# Patient Record
Sex: Female | Born: 1935 | Race: White | Hispanic: No | Smoking: Former smoker
Health system: Southern US, Community
[De-identification: ages and names within clinical notes are randomized; demographics above are authoritative.]

## PROBLEM LIST (undated history)

## (undated) DIAGNOSIS — F329 Major depressive disorder, single episode, unspecified: Secondary | ICD-10-CM

## (undated) DIAGNOSIS — F419 Anxiety disorder, unspecified: Secondary | ICD-10-CM

## (undated) DIAGNOSIS — G309 Alzheimer's disease, unspecified: Secondary | ICD-10-CM

## (undated) DIAGNOSIS — I10 Essential (primary) hypertension: Secondary | ICD-10-CM

## (undated) DIAGNOSIS — I639 Cerebral infarction, unspecified: Secondary | ICD-10-CM

## (undated) DIAGNOSIS — F0280 Dementia in other diseases classified elsewhere without behavioral disturbance: Secondary | ICD-10-CM

## (undated) DIAGNOSIS — E785 Hyperlipidemia, unspecified: Secondary | ICD-10-CM

## (undated) DIAGNOSIS — K5900 Constipation, unspecified: Secondary | ICD-10-CM

## (undated) DIAGNOSIS — F32A Depression, unspecified: Secondary | ICD-10-CM

## (undated) HISTORY — DX: Anxiety disorder, unspecified: F41.9

## (undated) HISTORY — DX: Depression, unspecified: F32.A

## (undated) HISTORY — PX: TUBAL LIGATION: SHX77

## (undated) HISTORY — DX: Cerebral infarction, unspecified: I63.9

## (undated) HISTORY — DX: Major depressive disorder, single episode, unspecified: F32.9

---

## 1999-04-10 ENCOUNTER — Encounter: Payer: Self-pay | Admitting: Internal Medicine

## 2004-03-10 ENCOUNTER — Encounter: Payer: Self-pay | Admitting: Internal Medicine

## 2004-07-27 ENCOUNTER — Ambulatory Visit: Payer: Self-pay | Admitting: Internal Medicine

## 2004-08-19 ENCOUNTER — Ambulatory Visit: Payer: Self-pay | Admitting: Gastroenterology

## 2004-08-26 ENCOUNTER — Ambulatory Visit: Payer: Self-pay | Admitting: Gastroenterology

## 2004-09-02 ENCOUNTER — Ambulatory Visit: Payer: Self-pay | Admitting: Internal Medicine

## 2005-01-14 ENCOUNTER — Ambulatory Visit: Payer: Self-pay | Admitting: Internal Medicine

## 2005-01-17 ENCOUNTER — Encounter: Payer: Self-pay | Admitting: Internal Medicine

## 2005-03-16 ENCOUNTER — Ambulatory Visit: Payer: Self-pay | Admitting: Internal Medicine

## 2005-06-11 ENCOUNTER — Ambulatory Visit: Payer: Self-pay | Admitting: Internal Medicine

## 2005-07-15 ENCOUNTER — Ambulatory Visit: Payer: Self-pay | Admitting: Internal Medicine

## 2005-10-29 ENCOUNTER — Ambulatory Visit: Payer: Self-pay | Admitting: Internal Medicine

## 2005-11-25 ENCOUNTER — Ambulatory Visit: Payer: Self-pay | Admitting: Internal Medicine

## 2006-02-28 ENCOUNTER — Ambulatory Visit: Payer: Self-pay | Admitting: Internal Medicine

## 2006-04-12 ENCOUNTER — Ambulatory Visit: Payer: Self-pay | Admitting: Internal Medicine

## 2006-06-28 ENCOUNTER — Ambulatory Visit: Payer: Self-pay | Admitting: Internal Medicine

## 2006-07-12 ENCOUNTER — Ambulatory Visit: Payer: Self-pay | Admitting: Internal Medicine

## 2006-09-28 ENCOUNTER — Ambulatory Visit: Payer: Self-pay | Admitting: Internal Medicine

## 2006-11-22 ENCOUNTER — Encounter: Payer: Self-pay | Admitting: Internal Medicine

## 2006-11-22 DIAGNOSIS — I10 Essential (primary) hypertension: Secondary | ICD-10-CM | POA: Insufficient documentation

## 2006-11-22 DIAGNOSIS — L219 Seborrheic dermatitis, unspecified: Secondary | ICD-10-CM | POA: Insufficient documentation

## 2006-11-22 DIAGNOSIS — M810 Age-related osteoporosis without current pathological fracture: Secondary | ICD-10-CM | POA: Insufficient documentation

## 2006-11-22 DIAGNOSIS — M1990 Unspecified osteoarthritis, unspecified site: Secondary | ICD-10-CM | POA: Insufficient documentation

## 2006-11-22 DIAGNOSIS — K5730 Diverticulosis of large intestine without perforation or abscess without bleeding: Secondary | ICD-10-CM | POA: Insufficient documentation

## 2006-11-22 DIAGNOSIS — F329 Major depressive disorder, single episode, unspecified: Secondary | ICD-10-CM

## 2006-11-22 DIAGNOSIS — K5900 Constipation, unspecified: Secondary | ICD-10-CM

## 2007-02-01 ENCOUNTER — Telehealth: Payer: Self-pay | Admitting: Internal Medicine

## 2007-03-01 ENCOUNTER — Ambulatory Visit: Payer: Self-pay | Admitting: Internal Medicine

## 2007-03-01 DIAGNOSIS — S335XXA Sprain of ligaments of lumbar spine, initial encounter: Secondary | ICD-10-CM

## 2007-03-01 DIAGNOSIS — S339XXA Sprain of unspecified parts of lumbar spine and pelvis, initial encounter: Secondary | ICD-10-CM | POA: Insufficient documentation

## 2007-03-08 ENCOUNTER — Ambulatory Visit: Payer: Self-pay | Admitting: Internal Medicine

## 2007-03-08 ENCOUNTER — Telehealth (INDEPENDENT_AMBULATORY_CARE_PROVIDER_SITE_OTHER): Payer: Self-pay | Admitting: *Deleted

## 2007-03-08 DIAGNOSIS — S8010XA Contusion of unspecified lower leg, initial encounter: Secondary | ICD-10-CM

## 2007-05-16 ENCOUNTER — Telehealth (INDEPENDENT_AMBULATORY_CARE_PROVIDER_SITE_OTHER): Payer: Self-pay | Admitting: *Deleted

## 2007-06-05 ENCOUNTER — Encounter (INDEPENDENT_AMBULATORY_CARE_PROVIDER_SITE_OTHER): Payer: Self-pay | Admitting: *Deleted

## 2007-06-05 ENCOUNTER — Ambulatory Visit: Payer: Self-pay | Admitting: Internal Medicine

## 2007-06-09 ENCOUNTER — Ambulatory Visit: Payer: Self-pay | Admitting: Internal Medicine

## 2007-06-12 LAB — CONVERTED CEMR LAB
Basophils Absolute: 0.1 10*3/uL (ref 0.0–0.1)
Calcium: 9.5 mg/dL (ref 8.4–10.5)
Eosinophils Absolute: 0.3 10*3/uL (ref 0.0–0.6)
GFR calc Af Amer: 80 mL/min
Glucose, Bld: 101 mg/dL — ABNORMAL HIGH (ref 70–99)
HCT: 39.9 % (ref 36.0–46.0)
MCHC: 35.6 g/dL (ref 30.0–36.0)
MCV: 94.9 fL (ref 78.0–100.0)
Monocytes Absolute: 0.7 10*3/uL (ref 0.2–0.7)
Neutrophils Relative %: 54.7 % (ref 43.0–77.0)
Potassium: 4.7 meq/L (ref 3.5–5.1)
RBC: 4.2 M/uL (ref 3.87–5.11)
Sodium: 142 meq/L (ref 135–145)

## 2007-06-21 ENCOUNTER — Telehealth: Payer: Self-pay | Admitting: Internal Medicine

## 2007-09-08 ENCOUNTER — Ambulatory Visit: Payer: Self-pay | Admitting: Internal Medicine

## 2007-09-08 DIAGNOSIS — K5289 Other specified noninfective gastroenteritis and colitis: Secondary | ICD-10-CM

## 2008-02-23 ENCOUNTER — Ambulatory Visit: Payer: Self-pay | Admitting: Internal Medicine

## 2008-02-23 DIAGNOSIS — L570 Actinic keratosis: Secondary | ICD-10-CM

## 2008-02-26 LAB — CONVERTED CEMR LAB
Basophils Absolute: 0.1 10*3/uL (ref 0.0–0.1)
Chloride: 102 meq/L (ref 96–112)
Eosinophils Absolute: 0.2 10*3/uL (ref 0.0–0.7)
GFR calc Af Amer: 79 mL/min
MCHC: 34.8 g/dL (ref 30.0–36.0)
MCV: 96 fL (ref 78.0–100.0)
Neutrophils Relative %: 63.3 % (ref 43.0–77.0)
Phosphorus: 4.3 mg/dL (ref 2.3–4.6)
Platelets: 220 10*3/uL (ref 150–400)
Potassium: 4.5 meq/L (ref 3.5–5.1)
RDW: 11.9 % (ref 11.5–14.6)
TSH: 1.79 microintl units/mL (ref 0.35–5.50)

## 2008-03-11 ENCOUNTER — Telehealth: Payer: Self-pay | Admitting: Internal Medicine

## 2008-03-28 ENCOUNTER — Ambulatory Visit: Payer: Self-pay | Admitting: Internal Medicine

## 2008-03-28 ENCOUNTER — Encounter: Payer: Self-pay | Admitting: Internal Medicine

## 2008-04-29 ENCOUNTER — Telehealth: Payer: Self-pay | Admitting: Internal Medicine

## 2008-05-06 ENCOUNTER — Ambulatory Visit: Payer: Self-pay | Admitting: Internal Medicine

## 2008-05-09 ENCOUNTER — Telehealth: Payer: Self-pay | Admitting: Internal Medicine

## 2008-06-10 ENCOUNTER — Telehealth: Payer: Self-pay | Admitting: Internal Medicine

## 2008-07-24 ENCOUNTER — Ambulatory Visit: Payer: Self-pay | Admitting: Family Medicine

## 2008-07-24 DIAGNOSIS — J309 Allergic rhinitis, unspecified: Secondary | ICD-10-CM | POA: Insufficient documentation

## 2008-07-24 DIAGNOSIS — J019 Acute sinusitis, unspecified: Secondary | ICD-10-CM

## 2009-08-05 ENCOUNTER — Ambulatory Visit: Payer: Self-pay | Admitting: Internal Medicine

## 2010-01-07 ENCOUNTER — Ambulatory Visit: Payer: Self-pay | Admitting: Internal Medicine

## 2010-01-13 ENCOUNTER — Ambulatory Visit: Payer: Self-pay | Admitting: Internal Medicine

## 2010-02-04 ENCOUNTER — Ambulatory Visit: Payer: Self-pay | Admitting: Internal Medicine

## 2010-02-06 ENCOUNTER — Ambulatory Visit: Payer: Self-pay | Admitting: Internal Medicine

## 2010-03-09 ENCOUNTER — Ambulatory Visit: Payer: Self-pay | Admitting: Internal Medicine

## 2012-03-15 ENCOUNTER — Ambulatory Visit: Payer: Self-pay | Admitting: Internal Medicine

## 2012-07-11 ENCOUNTER — Encounter: Payer: Self-pay | Admitting: Gastroenterology

## 2014-08-15 ENCOUNTER — Encounter: Payer: Self-pay | Admitting: Gastroenterology

## 2015-03-27 ENCOUNTER — Encounter: Payer: Self-pay | Admitting: Gastroenterology

## 2015-07-30 DIAGNOSIS — R296 Repeated falls: Secondary | ICD-10-CM | POA: Insufficient documentation

## 2015-08-28 DIAGNOSIS — R251 Tremor, unspecified: Secondary | ICD-10-CM | POA: Insufficient documentation

## 2015-10-07 DIAGNOSIS — G250 Essential tremor: Secondary | ICD-10-CM | POA: Insufficient documentation

## 2015-12-22 DIAGNOSIS — M25551 Pain in right hip: Secondary | ICD-10-CM | POA: Insufficient documentation

## 2015-12-22 DIAGNOSIS — R351 Nocturia: Secondary | ICD-10-CM | POA: Insufficient documentation

## 2016-01-15 ENCOUNTER — Ambulatory Visit: Payer: Medicare Other | Admitting: Physical Therapy

## 2016-01-20 ENCOUNTER — Ambulatory Visit: Payer: Medicare Other | Attending: Neurology | Admitting: Physical Therapy

## 2016-01-20 ENCOUNTER — Encounter: Payer: Self-pay | Admitting: Physical Therapy

## 2016-01-20 DIAGNOSIS — R279 Unspecified lack of coordination: Secondary | ICD-10-CM | POA: Diagnosis present

## 2016-01-20 DIAGNOSIS — R2681 Unsteadiness on feet: Secondary | ICD-10-CM | POA: Insufficient documentation

## 2016-01-20 DIAGNOSIS — M6281 Muscle weakness (generalized): Secondary | ICD-10-CM | POA: Insufficient documentation

## 2016-01-20 NOTE — Patient Instructions (Signed)
ABDUCTION: Sitting (Active)    Sit with feet flat. Lift right leg slightly and draw it out to side. Complete __2_ sets of _10__ repetitions. Perform _2__ sessions per day.  Copyright  VHI. All rights reserved.  EXTENSION: Sitting (Active)    Sit with feet flat. Straighten right knee. Complete _2__ sets of _10__ repetitions. Perform _2__ sessions per day.  http://gtsc.exer.us/269   Copyright  VHI. All rights reserved.  Sit to Stand / Stand to Sit / Transfers    Sit on edge of a solid chair with arms, feet flat on floor. Lean forward over feet and stand up with hands on chair arms. Sit down slowly with hands on chair arms. Repeat __5__ times per session. Do _3-4___ sessions per day.  Copyright  VHI. All rights reserved.  FLEXION: Sitting - Exercise Ball (Active)    Sit, both feet flat. Lift right knee toward ceiling. Complete __2_ sets of _10 repetitions. Perform __2_ sessions per day.  http://gtsc.exer.us/25   Copyright  VHI. All rights reserved.

## 2016-01-20 NOTE — Therapy (Signed)
[place] MAIN [facility] SERVICES [address] [place], [place], [address] Phone: [phone]   Fax:  [phone]  Physical Therapy Evaluation  Patient Details  Name: Latoya Morris MRN: [id number] Date of Birth: July 14, 1934 Referring Provider: Dr. Melrose Nakayama  Encounter Date: 01/20/2016      PT End of Session - 01/20/16 1608    Visit Number 1   Number of Visits 13   Date for PT Re-Evaluation 03/02/16   Authorization Type G Code 1   Authorization Time Period 10   PT Start Time 1330   PT Stop Time [id number]   PT Time Calculation (min) 61 min   Equipment Utilized During Treatment Gait belt   Activity Tolerance Patient tolerated treatment well;No increased pain   Behavior During Therapy [facility] for tasks assessed/performed      Past Medical History  Diagnosis Date  . Anxiety   . Depression   . Stroke [facility])     Doesn't remember year of stroke other than it has been a long time.    Past Surgical History  Procedure Laterality Date  . Tubal ligation      There were no vitals filed for this visit.       Subjective Assessment - 01/20/16 1333    Subjective Patient is a pleasant 82 y.o. female s/p chronic stroke with reported increased difficulties in ambulation, standing from sitting, and balance. 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 She has reported that she does experience falls with her most recent one occurring 2-3 weeks ago. Patient notes difficulty using stairs and needs handrails in order to perform. Patient uses no assist device during ambulation when inside the house and reports not leaving house frequently. When she leaves the house she reports using an electric carts when she goes to [employer]. Patient reports N/T in RLE, which she says she cannot remember the onest.    Pertinent History Pertinent Factors that Affect Rehab Potential:  limited transportation, previous stroke, falls, speech difficifulties    Limitations Walking;Standing;Sitting   How long can you sit comfortably? N/A   How long can you stand comfortably? Few minutes, which is limited because of weakness   How long can you walk comfortably? 200 feet   Patient Stated Goals walk better,    Currently in Pain? No/denies            [facility] PT Assessment - 01/21/16 0001    Assessment   Medical Diagnosis Difficulty walking   Referring Provider Dr. Melrose Nakayama   Onset Date/Surgical Date 12/08/15   Hand Dominance Right   Next MD Visit 3-4 months   Prior Therapy Received previously with stroke approximately 5 years ago; reports good progress with therapy; denies any rehab recently;   Precautions   Precautions Fall   Restrictions   Weight Bearing Restrictions No   Balance Screen   Has the patient fallen in the past 6 months Yes   How many times? 1   Has the patient had a decrease in activity level because of a fear of falling?  Yes   Is the patient reluctant to leave their home because of a fear of falling?  Yes   Home Environment   Additional Comments Lives with daughter in a single level home, 7-8 stairs to enter, railings both sides    Prior Function   Level of Independence Independent with household mobility  without device   Vocation Retired   [employer], looking at the [place]   Overall Cognitive Status Within Functional Limits for tasks assessed   Observation/Other Assessments   Observations Cranial nerves assessed through testing and patient report: All CNs are normal except patient has diminished CNXII on the L as indicated by an inability to push tongue into cheek on that side of mouth. Brachial, Achilles, and Patella reflexes 2+   Activities of Balance Confidence Scale (ABC Scale)  36%, low level of physical functioning    Sensation   Light Touch Appears Intact   Additional Comments Patient has minimal swelling in R  ankle as compared to L ankle.    Coordination   Gross Motor Movements are Fluid and Coordinated No   Coordination and Movement Description Patient has mild tremor predominantly in R UE at rest; tremor increased with movement. Performed LE RAMPs appropriately.   Finger Nose Finger Test L UE intact; Difficulties performing on R UE with increased tremors/dymetria, although no past pointing occurred.   Posture/Postural Control   Posture Comments Patient's seated posture reveals good balance except with LE movements with seated balance decreasing to a fair. Patient has slumped posture with forward head and rounded shoulders. Standing posture reveals slightly greater WB on L LE with continued slumped posture.     AROM   Overall AROM Comments Gross AROM assessment was [facility] throughout UE/LE with slight trunk extension compensation to perform B hip flexion.   Strength   Right Shoulder ABduction 3+/5   Left Shoulder ABduction 4/5   Right Hip Flexion 3-/5   Left Hip Flexion 4-/5   Right Knee Flexion 3+/5   Right Knee Extension 4/5   Left Knee Flexion 4-/5   Left Knee Extension 4+/5   Right Ankle Dorsiflexion 5/5   Left Ankle Dorsiflexion 5/5   Transfers   Comments From lower surface, patient needs min assist, with harder chair patient could perform at supervision.   Ambulation/Gait   Gait Comments Patient requires CGA/supervision with ambulation. Patient performs gait with decrease stride/step length, wider base of support, with decreased hip/knee flexion and decreased pelvic rotation. Patient caught R foot with no loss of balance.   Standardized Balance Assessment   Five times sit to stand comments  28.8 seconds; no UE use; >14.2 seconds indications patient is likely to have a balance dysfunction   10 Meter Walk 0.65, without AD; indications patient needs intervention to reduce falls risk   High Level Balance   High Level Balance Comments Difficulties maintaining sitting balance with LE movements,  which indicates that patient most likely has a weak core. Standing eyes open and closed patient had fair balance with minimal swaying but no loss of balance.          Treatment:  HEP Initiated (see patient instructions) Sit<>stand from regular chair without HHA x3-5 reps with cues for safe chair positioning; Seated: LAQ x5 reps BLE Alternate march x5 bilaterally; Hip abduction x5 reps BLE;  Patient required min-moderate verbal/tactile cues for correct exercise technique.                 PT Education - 01/20/16 1607    Education provided Yes   Education Details HEP initiated, goals of therapy,    Person(s) Educated Patient   Methods Explanation;Demonstration;Verbal cues   Comprehension Verbalized understanding;Returned demonstration             PT Long Term Goals - 01/20/16 1656  PT LONG TERM GOAL #1   Title Patient will demonstrate the ability to perform HEP independently in order to maintain strength and endurance gains in physical therapy to improve quality of life.   Time 6   Period Weeks   Status New   PT LONG TERM GOAL #2   Title Patient will decrease 5 times sit to stand <14 seconds in order to decrease her risk for falls and move safely in her home.   Time 6   Period Weeks   Status New   PT LONG TERM GOAL #3   Title Patient will increase her 10 meter walk time to 1.0 in order to decrease her risk for falls and perform ADLs safely.    Time 6   Period Weeks   Status New   PT LONG TERM GOAL #4   Title Patient will increase her overall strength to 4+/5 in UE and LE to allow the patient to perform gait and transfers more easily with decreased fall risk.   Time 6   Period Weeks   Status New   PT LONG TERM GOAL #5   Title Patient will improve Berg Balance score to >42 to increase patients mobility and decrease her risk for falls in the home with ADLs.    Time 6   Period Weeks   Status New               Plan - 01/20/16 1614     Clinical Impression Statement Patient is a 82 y.o. female with past stroke and general deconditioning resulting in deficits of gait, balance, strength, and coordination. Patient is at increased risk for falls based on unsteady gait appearance, 5 times sit to stand, and 10 meter walk. Patient's gait is slow with decreased UE swing, pelvic rotation, and stride/step length. Patient has general weakness with R>L resulting in difficulty performing sit to stands consistently. Patient shows fatigue after outcomes measures and ambulation for short distances, which she states requires her to take more rest breaks during ADLs. Patient would benefit from continued skilled PT in order to address balance deficits, decreased strength, gait abnormalities, and endurance in order to increase her safety in her home and improve her performance in ADLs.    Rehab Potential Fair   Clinical Impairments Affecting Rehab Potential Positive factors: motivated,  Negative factors: multiple falls, previous stroke, speech difficulties Clinical presentation: evolving due to being a high fall risk and has low physical functioning;    PT Frequency 2x / week   PT Duration 6 weeks   PT Treatment/Interventions ADLs/Self Care Home Management;Aquatic Therapy;Electrical Stimulation;Moist Heat;DME Instruction;Gait training;Stair training;Functional mobility training;Therapeutic activities;Therapeutic exercise;Balance training;Neuromuscular re-education;Patient/family education;Manual techniques;Energy conservation;Cryotherapy;Orthotic Fit/Training   PT Next Visit Plan Berg Balance, address balance, strengthening   PT Home Exercise Plan HEP initiated    Consulted and Agree with Plan of Care Patient;Family member/caregiver   Family Member Consulted Daughter      Patient will benefit from skilled therapeutic intervention in order to improve the following deficits and impairments:  Decreased activity tolerance, Decreased balance, Decreased  coordination, Decreased endurance, Decreased mobility, Decreased safety awareness, Decreased strength, Impaired UE functional use, Pain, Postural dysfunction, Improper body mechanics, Abnormal gait  Visit Diagnosis: Unsteadiness on feet - Plan: PT plan of care cert/re-cert  Muscle weakness (generalized) - Plan: PT plan of care cert/re-cert  Unspecified lack of coordination - Plan: PT plan of care cert/re-cert      G-Codes - [id number]    Functional Assessment Tool Used  10 meter walk, 5 times sit<>Stand, clinical judgement   Functional Limitation Mobility: Walking and moving around   Mobility: Walking and Moving Around Current Status [phone]) At least 40 percent but less than 60 percent impaired, limited or restricted   Mobility: Walking and Moving Around Goal Status [phone]) At least 20 percent but less than 40 percent impaired, limited or restricted       Problem List Patient Active Problem List   Diagnosis Date Noted  . SINUSITIS- ACUTE-NOS 07/24/2008  . ALLERGIC RHINITIS 07/24/2008  . ACTINIC KERATOSIS 02/23/2008  . GASTROENTERITIS 09/08/2007  . CONTUSION, LOWER LEG 03/08/2007  . LUMBOSACRAL STRAIN 03/01/2007  . DEPRESSION 2020/08/1706  . HYPERTENSION 2020/08/1706  . DIVERTICULOSIS, COLON 2020/08/1706  . CONSTIPATION 2020/08/1706  . SEBORRHEA 2020/08/1706  . OSTEOARTHRITIS 2020/08/1706  . OSTEOPOROSIS 2020/08/1706   Tilman Neat, SPT This entire session was performed under direct supervision and direction of a licensed therapist/therapist assistant . I have personally read, edited and approve of the note as written.  Trotter,Margaret PT, DPT 01/21/2016, 8:30 AM  [place] MAIN [facility] SERVICES [address] [place], [place], [address] Phone: [phone]   Fax:  [phone]  Name: KANANI ERNEY MRN: [id number] Date of Birth: [birth year]

## 2016-01-22 ENCOUNTER — Ambulatory Visit: Payer: Medicare Other | Admitting: Physical Therapy

## 2016-01-26 ENCOUNTER — Encounter: Payer: Self-pay | Admitting: Physical Therapy

## 2016-01-26 ENCOUNTER — Ambulatory Visit: Payer: Medicare Other | Admitting: Physical Therapy

## 2016-01-26 DIAGNOSIS — R279 Unspecified lack of coordination: Secondary | ICD-10-CM

## 2016-01-26 DIAGNOSIS — R2681 Unsteadiness on feet: Secondary | ICD-10-CM | POA: Diagnosis not present

## 2016-01-26 DIAGNOSIS — M6281 Muscle weakness (generalized): Secondary | ICD-10-CM

## 2016-01-26 NOTE — Therapy (Signed)
[place] MAIN [facility] SERVICES [address] [place], [place], [address] Phone: [phone]   Fax:  [phone]  Physical Therapy Treatment  Patient Details  Name: Latoya Morris MRN: [id number] Date of Birth: 06/05/1934 Referring Provider: Dr. Melrose Nakayama  Encounter Date: 01/26/2016      PT End of Session - 01/26/16 1742    Visit Number 2   Number of Visits 13   Date for PT Re-Evaluation 03/02/16   Authorization Type G Code 2   Authorization Time Period 10   PT Start Time [id number]   PT Stop Time 1735   PT Time Calculation (min) 47 min   Equipment Utilized During Treatment Gait belt   Activity Tolerance Patient tolerated treatment well;No increased pain   Behavior During Therapy [facility] for tasks assessed/performed      Past Medical History  Diagnosis Date  . Anxiety   . Depression   . Stroke [facility])     Doesn't remember year of stroke other than it not being recent    Past Surgical History  Procedure Laterality Date  . Tubal ligation      There were no vitals filed for this visit.      Subjective Assessment - 01/26/16 1651    Subjective Patient reports having no falls since last visit, reports not doing HEP due to losing her sheet;and reports feeling more fatigued due to the higher heat and humidity   Pertinent History Pertinent Factors that Affect Rehab Potential: limited transportation, previous stroke, falls, speech difficulties    Limitations Walking;Standing;Sitting   How long can you sit comfortably? N/A   How long can you stand comfortably? Few minutes, which is limited because of weakness   How long can you walk comfortably? 200 feet   Patient Stated Goals walk better,    Currently in Pain? No/denies            [facility] PT Assessment - 01/26/16 0001    Berg Balance Test   Sit to Stand Able to stand without using hands and stabilize independently   Standing Unsupported Able to stand safely 2 minutes   Sitting with Back  Unsupported but Feet Supported on Floor or Stool Able to sit safely and securely 2 minutes   Stand to Sit Sits safely with minimal use of hands   Transfers Able to transfer safely, minor use of hands   Standing Unsupported with Eyes Closed Able to stand 10 seconds safely   Standing Ubsupported with Feet Together Able to place feet together independently and stand for 1 minute with supervision   From Standing, Reach Forward with Outstretched Arm Reaches forward but needs supervision   From Standing Position, Pick up Object from [place] to pick up shoe, needs supervision   From Standing Position, Turn to Look Behind Over each Shoulder Looks behind from both sides and weight shifts well   Turn 360 Degrees Able to turn 360 degrees safely but slowly   Standing Unsupported, Alternately Place Feet on Step/Stool Able to stand independently and complete 8 steps >20 seconds   Standing Unsupported, One Foot in [employer] balance while stepping or standing   Standing on One Leg Unable to try or needs assist to prevent fall   Total Score 40       Treatment:  Berg Balance Assessment performed with multiple rest breaks; increased difficulty with SLS, tandem stance, 360 degree turn, and foot taps  Heel/toe raises; 2 x 10 reps; instructed to perform with  very little 1 UE support, min assist required to stabilize patient especially with toe raises.  Seated hip ABD/ADD with yellow theraband; 3 x 10 reps, patient instructed to move both legs equal distance and keep feet still to address gluteus medius weakness.  Patient instructed to perform step ups on 4" step with no UE support stepping up and 1 UE support stepping down due to decreased safety with eccentric lowering; 1 x 7-10 each LE.   Patient required multiple rest breaks due to fatigue and low endurance; SPT re-instructed and progressed HEP during breaks including sit to stands, seated hip ABD/ADD with no resistance, seated marches, and  LAQs.   Throughout testing and exercises, patient required min assist to maintain balance and min VCs to perform activities with appropriate technique in order to address specific muscles.                   PT Education - 01/26/16 1741    Education provided Yes   Education Details muscles contributing to functional weakness, muscles soreness    Person(s) Educated Patient   Methods Explanation;Demonstration;Verbal cues   Comprehension Verbal cues required;Verbalized understanding;Returned demonstration             PT Long Term Goals - 01/20/16 1656    PT LONG TERM GOAL #1   Title Patient will demonstrate the ability to perform HEP independently in order to maintain strength and endurance gains in physical therapy to improve quality of life.   Time 6   Period Weeks   Status New   PT LONG TERM GOAL #2   Title Patient will decrease 5 times sit to stand <14 seconds in order to decrease her risk for falls and move safely in her home.   Time 6   Period Weeks   Status New   PT LONG TERM GOAL #3   Title Patient will increase her 10 meter walk time to 1.0 in order to decrease her risk for falls and perform ADLs safely.    Time 6   Period Weeks   Status New   PT LONG TERM GOAL #4   Title Patient will increase her overall strength to 4+/5 in UE and LE to allow the patient to perform gait and transfers more easily with decreased fall risk.   Time 6   Period Weeks   Status New   PT LONG TERM GOAL #5   Title Patient will improve Berg Balance score to >42 to increase patients mobility and decrease her risk for falls in the home with ADLs.    Time 6   Period Weeks   Status New               Plan - 01/26/16 1744    Clinical Impression Statement During Berg balance testing, patient performed static double leg stance well but has increased difficulty with single leg stance and Rombergs. Patient has weakness in BLE which seems to effect her balance in single leg  stance.  Patient notes fatigue after exercises and requires multiple rest breaks. Patient would continue to benefit from skilled PT intervention in order to address weakness, balance, and improve safety in her ADL performance.    Rehab Potential Fair   Clinical Impairments Affecting Rehab Potential Positive factors: motivated,  Negative factors: multiple falls, previous stroke, speech difficulties Clinical presentation: evolving due to being a high fall risk and has low physical functioning;    PT Frequency 2x / week   PT Duration 6 weeks  PT Treatment/Interventions ADLs/Self Care Home Management;Aquatic Therapy;Electrical Stimulation;Moist Heat;DME Instruction;Gait training;Stair training;Functional mobility training;Therapeutic activities;Therapeutic exercise;Balance training;Neuromuscular re-education;Patient/family education;Manual techniques;Energy conservation;Cryotherapy;Orthotic Fit/Training   PT Next Visit Plan Address balance, strengthening   PT Home Exercise Plan HEP progressed    Consulted and Agree with Plan of Care Patient;Family member/caregiver   Family Member Consulted Daughter      Patient will benefit from skilled therapeutic intervention in order to improve the following deficits and impairments:  Decreased activity tolerance, Decreased balance, Decreased coordination, Decreased endurance, Decreased mobility, Decreased safety awareness, Decreased strength, Impaired UE functional use, Pain, Postural dysfunction, Improper body mechanics, Abnormal gait  Visit Diagnosis: Unsteadiness on feet  Muscle weakness (generalized)  Unspecified lack of coordination     Problem List Patient Active Problem List   Diagnosis Date Noted  . SINUSITIS- ACUTE-NOS 07/24/2008  . ALLERGIC RHINITIS 07/24/2008  . ACTINIC KERATOSIS 02/23/2008  . GASTROENTERITIS 09/08/2007  . CONTUSION, LOWER LEG 03/08/2007  . LUMBOSACRAL STRAIN 03/01/2007  . DEPRESSION December 14, 202008  . HYPERTENSION  December 14, 202008  . DIVERTICULOSIS, COLON December 14, 202008  . CONSTIPATION December 14, 202008  . SEBORRHEA December 14, 202008  . OSTEOARTHRITIS December 14, 202008  . OSTEOPOROSIS December 14, 202008   Tilman Neat, SPT [place] 01/26/2016, 5:52 PM  [place] MAIN [facility] SERVICES [address] [place], [place], [address] Phone: [phone]   Fax:  [phone]  Name: Latoya Morris MRN: [id number] Date of Birth: [birth year]

## 2016-01-26 NOTE — Patient Instructions (Signed)
Sit to Stand / Stand to Sit / Transfers    Sit on edge of a solid chair with arms, feet flat on floor. Lean forward over feet and stand up with hands on chair arms. Sit down slowly with hands on chair arms. Repeat __10__ times per session. Do __2__ sessions per day.  Copyright  VHI. All rights reserved.  EXTENSION: Sitting - Exercise Ball (Active)    Sit with feet flat. Straighten right knee. Complete _2__ sets of _10__ repetitions. Perform __2_ sessions per day.  http://gtsc.exer.us/275   Copyright  VHI. All rights reserved.  FLEXION: Sitting (Active)    Sit, both feet flat. Lift right knee toward ceiling. . Complete __2_ sets of _10__ repetitions. Perform _2__ sessions per day.  Copyright  VHI. All rights reserved.  ABDUCTION: Sitting - Exercise Ball: Resistance Band (Active)    Sit with feet flat. Lift right leg slightly and, against yellow resistance band, draw it out to side. Complete _3__ sets of _10__ repetitions. Perform _2__ sessions per day.  Copyright  VHI. All rights reserved.

## 2016-01-27 ENCOUNTER — Encounter: Payer: Self-pay | Admitting: Physical Therapy

## 2016-01-29 ENCOUNTER — Ambulatory Visit: Payer: Medicare Other | Admitting: Physical Therapy

## 2016-01-29 ENCOUNTER — Encounter: Payer: Self-pay | Admitting: Physical Therapy

## 2016-01-29 DIAGNOSIS — R279 Unspecified lack of coordination: Secondary | ICD-10-CM

## 2016-01-29 DIAGNOSIS — R2681 Unsteadiness on feet: Secondary | ICD-10-CM

## 2016-01-29 DIAGNOSIS — M6281 Muscle weakness (generalized): Secondary | ICD-10-CM

## 2016-01-29 NOTE — Therapy (Signed)
[place] MAIN [facility] SERVICES [address] [place], [place], [address] Phone: [phone]   Fax:  [phone]  Physical Therapy Treatment  Patient Details  Name: Latoya Morris MRN: [id number] Date of Birth: 1933-11-29 Referring Provider: Dr. Melrose Nakayama  Encounter Date: 01/29/2016      PT End of Session - 01/29/16 1650    Visit Number 3   Number of Visits 13   Date for PT Re-Evaluation 03/02/16   Authorization Type G Code 3   Authorization Time Period 10   PT Start Time 1600   PT Stop Time 1645   PT Time Calculation (min) 45 min   Equipment Utilized During Treatment Gait belt   Activity Tolerance Patient tolerated treatment well;Patient limited by fatigue   Behavior During Therapy [facility] for tasks assessed/performed      Past Medical History  Diagnosis Date  . Anxiety   . Depression   . Stroke [facility])     Doesn't remember year of stroke other than it not being recent    Past Surgical History  Procedure Laterality Date  . Tubal ligation      There were no vitals filed for this visit.      Subjective Assessment - 01/29/16 1649    Subjective Pt reports being compliant with her HEP.  She does report some numbess in her RLE that is more than her usual.     Pertinent History Pertinent Factors that Affect Rehab Potential: limited transportation, previous stroke, falls, speech difficulties    Limitations Walking;Standing;Sitting   How long can you sit comfortably? N/A   How long can you stand comfortably? Few minutes, which is limited because of weakness   How long can you walk comfortably? 200 feet   Patient Stated Goals walk better,    Currently in Pain? No/denies      Treatment   Nustep x 4 mins on level 1 with BLE (unbilled) Standing resisted hip flexion with red theraband, pt required min VCs to increase range and CGA for stability, 2 sets x 10 reps   Pt instructed in 4 way hip for RLE with red theraband resistance, pt  required 2 HHA, and min VCs to maintain knee extension, 10 reps each direction   Sideways walking on airex balance beam with 2 HHA x 2 laps Tandem walking on airex balance bean with 2 HHA progressing to 1 HHA and CGA for stability   Dual tasking while standing on blue airex pad and shooting 15 balls in hoop, min VCs for glut activation and no HHA x 2 sets    Step ups performed on aerobic step with 2 risers while starting on blue airex pad, 2 sets x 10 reps, 1 HHA required and min VCs for upright posture and foot clearance   Heel raises using 2 HHA, 2 sets x 10 reps, min VCs to increase range   Reinforced HEP with no additions                               PT Education - 01/29/16 1650    Education provided Yes   Education Details continuation of HEP, importance of hip strengthening and balance    Person(s) Educated Patient   Methods Explanation;Demonstration;Verbal cues   Comprehension Verbalized understanding;Returned demonstration;Verbal cues required             PT Long Term Goals - 01/20/16 1656    PT LONG  TERM GOAL #1   Title Patient will demonstrate the ability to perform HEP independently in order to maintain strength and endurance gains in physical therapy to improve quality of life.   Time 6   Period Weeks   Status New   PT LONG TERM GOAL #2   Title Patient will decrease 5 times sit to stand <14 seconds in order to decrease her risk for falls and move safely in her home.   Time 6   Period Weeks   Status New   PT LONG TERM GOAL #3   Title Patient will increase her 10 meter walk time to 1.0 in order to decrease her risk for falls and perform ADLs safely.    Time 6   Period Weeks   Status New   PT LONG TERM GOAL #4   Title Patient will increase her overall strength to 4+/5 in UE and LE to allow the patient to perform gait and transfers more easily with decreased fall risk.   Time 6   Period Weeks   Status New   PT LONG TERM GOAL #5    Title Patient will improve Berg Balance score to >42 to increase patients mobility and decrease her risk for falls in the home with ADLs.    Time 6   Period Weeks   Status New               Plan - 01/29/16 1651    Clinical Impression Statement Pt requires frequent rest breaks due to fatigue but reports no increase in pain during session.  She was able to advance to 4 way hip for RLE x 10 reps and requires cues for upright posture.  Pt demonstrated no LOB while standing on blue airex pad throwing ball into goal but did require CGA for stability.  Pt was able to progress with step ups by starting on a blue airex and stepping onto aerobic step with 2 risers.  She is limited by her fatigue and LE strength.  She would continue to benefit from skilled PT in order to strengthen her LEs, and dynamic balance for greater functional mobility.    Rehab Potential Fair   Clinical Impairments Affecting Rehab Potential Positive factors: motivated,  Negative factors: multiple falls, previous stroke, speech difficulties Clinical presentation: evolving due to being a high fall risk and has low physical functioning;    PT Frequency 2x / week   PT Duration 6 weeks   PT Treatment/Interventions ADLs/Self Care Home Management;Aquatic Therapy;Electrical Stimulation;Moist Heat;DME Instruction;Gait training;Stair training;Functional mobility training;Therapeutic activities;Therapeutic exercise;Balance training;Neuromuscular re-education;Patient/family education;Manual techniques;Energy conservation;Cryotherapy;Orthotic Fit/Training   PT Next Visit Plan Address balance, strengthening   PT Home Exercise Plan continuation of HEP    Consulted and Agree with Plan of Care Patient;Family member/caregiver      Patient will benefit from skilled therapeutic intervention in order to improve the following deficits and impairments:  Decreased activity tolerance, Decreased balance, Decreased coordination, Decreased endurance,  Decreased mobility, Decreased safety awareness, Decreased strength, Impaired UE functional use, Pain, Postural dysfunction, Improper body mechanics, Abnormal gait  Visit Diagnosis: Unsteadiness on feet  Muscle weakness (generalized)  Unspecified lack of coordination     Problem List Patient Active Problem List   Diagnosis Date Noted  . SINUSITIS- ACUTE-NOS 07/24/2008  . ALLERGIC RHINITIS 07/24/2008  . ACTINIC KERATOSIS 02/23/2008  . GASTROENTERITIS 09/08/2007  . CONTUSION, LOWER LEG 03/08/2007  . LUMBOSACRAL STRAIN 03/01/2007  . DEPRESSION October 08, 202008  . HYPERTENSION October 08, 202008  . DIVERTICULOSIS, COLON October 08, 202008  .  CONSTIPATION 2020/09/406  . SEBORRHEA 2020/09/406  . OSTEOARTHRITIS 2020/09/406  . OSTEOPOROSIS 2020/09/406   Stacy Gardner, SPT  This entire session was performed under direct supervision and direction of a licensed therapist/therapist assistant . I have personally read, edited and approve of the note as written.  Trotter,Margaret PT, DPT 01/30/2016, 8:21 AM  [place] MAIN [facility] SERVICES [address] [place], [place], [address] Phone: [phone]   Fax:  [phone]  Name: RHENA OLIVERSON MRN: [id number] Date of Birth: Mar 12, 1934

## 2016-02-02 ENCOUNTER — Ambulatory Visit: Payer: Medicare Other | Admitting: Physical Therapy

## 2016-02-02 ENCOUNTER — Encounter: Payer: Self-pay | Admitting: Physical Therapy

## 2016-02-02 DIAGNOSIS — R279 Unspecified lack of coordination: Secondary | ICD-10-CM

## 2016-02-02 DIAGNOSIS — R2681 Unsteadiness on feet: Secondary | ICD-10-CM

## 2016-02-02 DIAGNOSIS — M6281 Muscle weakness (generalized): Secondary | ICD-10-CM

## 2016-02-02 NOTE — Therapy (Signed)
[place] MAIN [facility] SERVICES [address] [place], [place], [address] Phone: [phone]   Fax:  [phone]  Physical Therapy Treatment  Patient Details  Name: Latoya Morris MRN: [id number] Date of Birth: Aug 17, 1933 Referring Provider: Dr. Melrose Nakayama  Encounter Date: 02/02/2016      PT End of Session - 02/02/16 1736    Visit Number 4   Number of Visits 13   Date for PT Re-Evaluation 03/02/16   Authorization Type G Code 4   Authorization Time Period 10   PT Start Time [id number]   PT Stop Time 1731   PT Time Calculation (min) 45 min   Equipment Utilized During Treatment Gait belt   Activity Tolerance Patient tolerated treatment well;Patient limited by fatigue   Behavior During Therapy [facility] for tasks assessed/performed      Past Medical History  Diagnosis Date  . Anxiety   . Depression   . Stroke [facility])     Doesn't remember year of stroke other than it not being recent    Past Surgical History  Procedure Laterality Date  . Tubal ligation      There were no vitals filed for this visit.      Subjective Assessment - 02/02/16 1651    Subjective Pt reports being tired today due to being out running errands all day. She reports doing HEP but not as frequently as suggested.    Pertinent History Pertinent Factors that Affect Rehab Potential: limited transportation, previous stroke, falls, speech difficulties    Limitations Walking;Standing;Sitting   How long can you sit comfortably? N/A   How long can you stand comfortably? Few minutes, which is limited because of weakness   How long can you walk comfortably? 200 feet   Patient Stated Goals walk better,    Currently in Pain? No/denies      Treatment:  Warm Up: NuStep, L2, 3 min, UE/LE (unbilled)  TherEx:  Standing 4 hip exercises, deferred to later session due to decreased ability to perform with appropriate form after verbal and tactile cueing  Sidelying clam shells; 3 x 10 BLE,  instructed to keep hips rolled forward and feet together while lifting leg to address gluteus medius muscle.  Seated hip ABD with red theraband; 2 x 10 reps; min VCs required. Patient has slight difficulty tying red tband due to lack of RUE coordination.   Ankle PF with no UE support, 2 x 10; cued to perform with control both eccentrically and concentrically.   Neuromuscular Training (performed in // bars):  Standing on firm surface, patient instructed to kick soccer ball to SPT; required min assist to maintain balance.  Walking on airex balance beam, attempted with no UE support but too difficult, patient used 1 UE; performed x2  Reps, very difficult  Standing on airex balance beam, instructed to perform weight shifts to touch external cues x 4, BUE used; no UE on // bars  Standing weight shifts (firm ground) to touch cards on mirror (multiple external cues); able to perform greater weight shifts compared to airex pad reaches; required multiple cues   Standing balloon hits; multiple reps completed; BUE on airex pad and firm surface. Patient cued to stand with feet closer together in order to increase challenge to her balance.   Throughout the session, patient required multiple VCs and CGA-min assist to maintain stability.  PT Education - 02/02/16 1735    Education provided Yes   Education Details HEP progression,    Person(s) Educated Patient   Methods Explanation;Tactile cues;Verbal cues;Handout   Comprehension Returned demonstration;Verbalized understanding;Tactile cues required             PT Long Term Goals - 01/20/16 1656    PT LONG TERM GOAL #1   Title Patient will demonstrate the ability to perform HEP independently in order to maintain strength and endurance gains in physical therapy to improve quality of life.   Time 6   Period Weeks   Status New   PT LONG TERM GOAL #2   Title Patient will decrease 5 times sit to  stand <14 seconds in order to decrease her risk for falls and move safely in her home.   Time 6   Period Weeks   Status New   PT LONG TERM GOAL #3   Title Patient will increase her 10 meter walk time to 1.0 in order to decrease her risk for falls and perform ADLs safely.    Time 6   Period Weeks   Status New   PT LONG TERM GOAL #4   Title Patient will increase her overall strength to 4+/5 in UE and LE to allow the patient to perform gait and transfers more easily with decreased fall risk.   Time 6   Period Weeks   Status New   PT LONG TERM GOAL #5   Title Patient will improve Berg Balance score to >42 to increase patients mobility and decrease her risk for falls in the home with ADLs.    Time 6   Period Weeks   Status New               Plan - 02/02/16 1736    Clinical Impression Statement Patient presents to therapy already fatigued and requires rest breaks throughout session due to fatigue and shortness of breath.  Patient needs multiple VCs today to perform activities correctly especially with clam shells to target gluteus medius muscle specifically. Patient requires CGA-min assist  to maintain stability throughout session. 4 way hip deferred until later time due to decreased ability to perform with appropriate form.  Patient would benefit from continued skilled PT intervention in order to address weakness, coordiation, fatigue, and balance for greater independence with mobility.    Rehab Potential Fair   Clinical Impairments Affecting Rehab Potential Positive factors: motivated,  Negative factors: multiple falls, previous stroke, speech difficulties Clinical presentation: evolving due to being a high fall risk and has low physical functioning;    PT Frequency 2x / week   PT Duration 6 weeks   PT Treatment/Interventions ADLs/Self Care Home Management;Aquatic Therapy;Electrical Stimulation;Moist Heat;DME Instruction;Gait training;Stair training;Functional mobility  training;Therapeutic activities;Therapeutic exercise;Balance training;Neuromuscular re-education;Patient/family education;Manual techniques;Energy conservation;Cryotherapy;Orthotic Fit/Training   PT Next Visit Plan Address balance, strengthening, coordination   PT Home Exercise Plan HEP progressed   Consulted and Agree with Plan of Care Patient;Family member/caregiver      Patient will benefit from skilled therapeutic intervention in order to improve the following deficits and impairments:  Decreased activity tolerance, Decreased balance, Decreased coordination, Decreased endurance, Decreased mobility, Decreased safety awareness, Decreased strength, Impaired UE functional use, Pain, Postural dysfunction, Improper body mechanics, Abnormal gait  Visit Diagnosis: Unsteadiness on feet  Muscle weakness (generalized)  Unspecified lack of coordination     Problem List Patient Active Problem List   Diagnosis Date Noted  . SINUSITIS- ACUTE-NOS 07/24/2008  . ALLERGIC RHINITIS  07/24/2008  . ACTINIC KERATOSIS 02/23/2008  . GASTROENTERITIS 09/08/2007  . CONTUSION, LOWER LEG 03/08/2007  . LUMBOSACRAL STRAIN 03/01/2007  . DEPRESSION 05-12-2007  . HYPERTENSION 05-12-2007  . DIVERTICULOSIS, COLON 05-12-2007  . CONSTIPATION 05-12-2007  . SEBORRHEA 05-12-2007  . OSTEOARTHRITIS 05-12-2007  . OSTEOPOROSIS 05-12-2007   Tilman Neat, SPT This entire session was performed under direct supervision and direction of a licensed therapist/therapist assistant . I have personally read, edited and approve of the note as written.  Trotter,Margaret PT, DPT 02/03/2016, 9:13 AM  [place] MAIN [facility] SERVICES [address] [place], [place], [address] Phone: [phone]   Fax:  [phone]  Name: SHERROL BATTERMAN MRN: [id number] Date of Birth: 19-Feb-1934

## 2016-02-02 NOTE — Patient Instructions (Signed)
Abduction: Clam (Eccentric) - Side-Lying    Lie on side with knees bent. Lift top knee, keeping feet together. Keep trunk steady. Slowly lower for 3-5 seconds.  10 reps per set, 2 sets per day, 5 days per week.   http://ecce.exer.us/65   Copyright  VHI. All rights reserved.   

## 2016-02-03 ENCOUNTER — Encounter: Payer: Self-pay | Admitting: Physical Therapy

## 2016-02-04 ENCOUNTER — Encounter: Payer: Self-pay | Admitting: Physical Therapy

## 2016-02-04 ENCOUNTER — Ambulatory Visit: Payer: Medicare Other | Admitting: Physical Therapy

## 2016-02-04 DIAGNOSIS — R279 Unspecified lack of coordination: Secondary | ICD-10-CM

## 2016-02-04 DIAGNOSIS — R2681 Unsteadiness on feet: Secondary | ICD-10-CM | POA: Diagnosis not present

## 2016-02-04 DIAGNOSIS — M6281 Muscle weakness (generalized): Secondary | ICD-10-CM

## 2016-02-04 NOTE — Patient Instructions (Addendum)
  Abdominals: Single Leg Bend    Lying on back with legs out straight, inhale, then exhale while slowly sliding heel along floor toward buttocks. Slowly return to starting position. Repeat __5__ times each leg per set. Do __2__ sets per session. Do __2__ sessions per day.  Copyright  VHI. All rights reserved.

## 2016-02-04 NOTE — Therapy (Signed)
[place] MAIN [facility] SERVICES [address] [place], [place], [address] Phone: [phone]   Fax:  [phone]  Physical Therapy Treatment  Patient Details  Name: Latoya Morris MRN: [id number] Date of Birth: 09-19-1933 Referring Provider: Dr. Melrose Nakayama  Encounter Date: 02/04/2016      PT End of Session - 02/04/16 1628    Visit Number 5   Number of Visits 13   Date for PT Re-Evaluation 03/02/16   Authorization Type G Code 5   Authorization Time Period 10   PT Start Time 1518   PT Stop Time 1601   PT Time Calculation (min) 43 min   Equipment Utilized During Treatment Gait belt   Activity Tolerance Patient tolerated treatment well;Patient limited by fatigue   Behavior During Therapy [facility] for tasks assessed/performed      Past Medical History  Diagnosis Date  . Anxiety   . Depression   . Stroke [facility])     Doesn't remember year of stroke other than it not being recent    Past Surgical History  Procedure Laterality Date  . Tubal ligation      There were no vitals filed for this visit.      Subjective Assessment - 02/04/16 1521    Subjective Pt reports feeling better compared to last time. She reports a near fall but did not fall because she was able to catch herself. She reports HEP is going well.    Pertinent History Pertinent Factors that Affect Rehab Potential: limited transportation, previous stroke, falls, speech difficulties    Limitations Walking;Standing;Sitting   How long can you sit comfortably? N/A   How long can you stand comfortably? Few minutes, which is limited because of weakness   How long can you walk comfortably? 200 feet   Patient Stated Goals walk better,    Currently in Pain? No/denies     Treatment:  Warm Up: NuStep L2, BUE/LE x 3 minutes (unbilled)  Sidelying clamshells, red tband, 2 x 10; mutliple verbal and tactile cues in order to perform with appropriate technique and address glut med  Sabeo ball  placements; airex pad with feet shoulder width apart (x2) and feet together (x2), 4 balls moved each time. Decreased coordination with RUE.  Min tactile assist to ensure safety, loss of balance 1-2 times.  Resisted walking; forwards and laterally (L and R leg leading); 2.5#; x2 each direction Min VCs for patient to perform with correct technique, min assist required for patient to maintain stability, 1 loss of balance.  Balance on airex pad while performing toes taps on 4" step; 1 UE support due to difficulty level, patient required VCs to perform exercise with least UE assistance, min assist for stability.  Stepping over bolsters during ambulation; 2 bolsters x4 passes; VCs to move closer to bolsters before stepping over in order to allow easier clearance.  Core exercise initiated to address stability during balance; supine marches; x 10 reps; increased cueing to perform with core contraction.     Patient requires mod VCs and min tactile assist throughout treatment to perform activities correctly and be safe.                          PT Education - 02/04/16 1627    Education provided Yes   Education Details HEP progression, strengthening   Person(s) Educated Patient   Methods Explanation;Demonstration;Verbal cues   Comprehension Verbalized understanding;Verbal cues required;Returned demonstration  PT Long Term Goals - 01/20/16 1656    PT LONG TERM GOAL #1   Title Patient will demonstrate the ability to perform HEP independently in order to maintain strength and endurance gains in physical therapy to improve quality of life.   Time 6   Period Weeks   Status New   PT LONG TERM GOAL #2   Title Patient will decrease 5 times sit to stand <14 seconds in order to decrease her risk for falls and move safely in her home.   Time 6   Period Weeks   Status New   PT LONG TERM GOAL #3   Title Patient will increase her 10 meter walk time to 1.0 in order to  decrease her risk for falls and perform ADLs safely.    Time 6   Period Weeks   Status New   PT LONG TERM GOAL #4   Title Patient will increase her overall strength to 4+/5 in UE and LE to allow the patient to perform gait and transfers more easily with decreased fall risk.   Time 6   Period Weeks   Status New   PT LONG TERM GOAL #5   Title Patient will improve Berg Balance score to >42 to increase patients mobility and decrease her risk for falls in the home with ADLs.    Time 6   Period Weeks   Status New               Plan - 02/04/16 1628    Clinical Impression Statement Patient still requires multiple rest breaks throughout session due to low endurance level. Patient needs mod VCs to perform exercises correctly; once mutliple cues are given, patient is able to perform appropriately. Patient required min tactile assist to maintain stability during exercises and balance training. Patient would benefit from continued skilled therapy in order to address coordination, fatigue, and strength to allow for independence in mobility.     Rehab Potential Fair   Clinical Impairments Affecting Rehab Potential Positive factors: motivated,  Negative factors: multiple falls, previous stroke, speech difficulties Clinical presentation: evolving due to being a high fall risk and has low physical functioning;    PT Frequency 2x / week   PT Duration 6 weeks   PT Treatment/Interventions ADLs/Self Care Home Management;Aquatic Therapy;Electrical Stimulation;Moist Heat;DME Instruction;Gait training;Stair training;Functional mobility training;Therapeutic activities;Therapeutic exercise;Balance training;Neuromuscular re-education;Patient/family education;Manual techniques;Energy conservation;Cryotherapy;Orthotic Fit/Training   PT Next Visit Plan Address balance, strengthening, coordination   PT Home Exercise Plan HEP progressed   Consulted and Agree with Plan of Care Patient;Family member/caregiver       Patient will benefit from skilled therapeutic intervention in order to improve the following deficits and impairments:  Decreased activity tolerance, Decreased balance, Decreased coordination, Decreased endurance, Decreased mobility, Decreased safety awareness, Decreased strength, Impaired UE functional use, Pain, Postural dysfunction, Improper body mechanics, Abnormal gait  Visit Diagnosis: Unsteadiness on feet  Muscle weakness (generalized)  Unspecified lack of coordination     Problem List Patient Active Problem List   Diagnosis Date Noted  . SINUSITIS- ACUTE-NOS 07/24/2008  . ALLERGIC RHINITIS 07/24/2008  . ACTINIC KERATOSIS 02/23/2008  . GASTROENTERITIS 09/08/2007  . CONTUSION, LOWER LEG 03/08/2007  . LUMBOSACRAL STRAIN 03/01/2007  . DEPRESSION January 11, 202008  . HYPERTENSION January 11, 202008  . DIVERTICULOSIS, COLON January 11, 202008  . CONSTIPATION January 11, 202008  . SEBORRHEA January 11, 202008  . OSTEOARTHRITIS January 11, 202008  . OSTEOPOROSIS January 11, 202008   Tilman Neat, SPT This entire session was performed under direct supervision and direction of a licensed therapist/therapist assistant .  I have personally read, edited and approve of the note as written.  Trotter,Margaret PT, DPT 02/05/2016, 10:23 AM  [place] MAIN [facility] SERVICES [address] [place], [place], [address] Phone: [phone]   Fax:  [phone]  Name: Latoya Morris MRN: [id number] Date of Birth: 1933/09/10

## 2016-02-05 ENCOUNTER — Encounter: Payer: Self-pay | Admitting: Physical Therapy

## 2016-02-11 ENCOUNTER — Ambulatory Visit: Payer: Medicare Other | Attending: Neurology | Admitting: Physical Therapy

## 2016-02-11 DIAGNOSIS — R279 Unspecified lack of coordination: Secondary | ICD-10-CM | POA: Insufficient documentation

## 2016-02-11 DIAGNOSIS — M6281 Muscle weakness (generalized): Secondary | ICD-10-CM | POA: Insufficient documentation

## 2016-02-11 DIAGNOSIS — R2681 Unsteadiness on feet: Secondary | ICD-10-CM | POA: Insufficient documentation

## 2016-02-12 ENCOUNTER — Ambulatory Visit: Payer: Self-pay | Admitting: Physical Therapy

## 2016-02-13 ENCOUNTER — Ambulatory Visit: Payer: Medicare Other | Admitting: Physical Therapy

## 2016-02-13 ENCOUNTER — Encounter: Payer: Self-pay | Admitting: Physical Therapy

## 2016-02-13 DIAGNOSIS — R279 Unspecified lack of coordination: Secondary | ICD-10-CM | POA: Diagnosis present

## 2016-02-13 DIAGNOSIS — R2681 Unsteadiness on feet: Secondary | ICD-10-CM

## 2016-02-13 DIAGNOSIS — M6281 Muscle weakness (generalized): Secondary | ICD-10-CM | POA: Diagnosis present

## 2016-02-13 NOTE — Therapy (Signed)
[place] MAIN [facility] SERVICES [address] [place], [place], [address] Phone: [phone]   Fax:  [phone]  Physical Therapy Treatment  Patient Details  Name: Latoya Morris MRN: [id number] Date of Birth: [birth year] Referring Provider: Dr. Melrose Nakayama  Encounter Date: 02/13/2016      PT End of Session - 02/13/16 1617    Visit Number 6   Number of Visits 13   Date for PT Re-Evaluation 03/02/16   Authorization Type G Code 6   Authorization Time Period 10   PT Start Time 1517   PT Stop Time 1600   PT Time Calculation (min) 43 min   Equipment Utilized During Treatment Gait belt   Activity Tolerance Patient tolerated treatment well;Patient limited by fatigue   Behavior During Therapy [facility] for tasks assessed/performed      Past Medical History  Diagnosis Date  . Anxiety   . Depression   . Stroke [facility])     Doesn't remember year of stroke other than it not being recent    Past Surgical History  Procedure Laterality Date  . Tubal ligation      There were no vitals filed for this visit.      Subjective Assessment - 02/13/16 1522    Subjective Pt reports feeling fatigued this whole week. She reports she has not had any falls since her last physical therapy session. She reports that she hasn't been able to perform HEP.    Pertinent History Pertinent Factors that Affect Rehab Potential: limited transportation, previous stroke, falls, speech difficulties    Limitations Walking;Standing;Sitting   How long can you sit comfortably? N/A   How long can you stand comfortably? Few minutes, which is limited because of weakness   How long can you walk comfortably? 200 feet   Patient Stated Goals walk better,    Currently in Pain? No/denies        Treatment:  NuStep L3, BUE/LE warm up, x 4 min, recorded history during NuStep.  Supine bridges, 2 x 10; VCs to improve range and increase range  Educated on how to roll and sit up to protect  back.   Sit to stands, elevated surface, with UE moving weighted ball into flexion once standing (4.4#), VCs for eccentric control when sitting, x 5 reps, x 10 reps.   Dynamic walking on blue airex balance beam forwards/backwards x 5 with, side stepping x5;  L and R hand stacking of objects for coordination x 5 each UE  Required min-mod assist to maintain balance, multiple VCs in order to be safe. 1 UE assist throughout.   Standing on airex feet apart and feet together, with 20 UE movements into shoulder flexion  Tandem stance with leading foot on blue dyandisc, standing without UE use (x 20 seconds each LE), with BUE shoulder flexion (2x5 reps). Min Assist to maintain balance, no UE support throughout.  Blue airex pad heel to toe raises, no UE support, 2x 10 with min assist to maintain stability, increased difficulty with toe raises.  Standing on airex, BUE ball pass x10 each direction;  CGA and min assist to maintain balance throughout neuromuscular re-education.                          PT Education - 02/13/16 1616    Education provided Yes   Education Details strengthening, walking to decrease stiffness, activity level   Person(s) Educated Patient   Methods Explanation;Verbal cues;Demonstration  Comprehension Verbalized understanding;Returned demonstration;Verbal cues required             PT Long Term Goals - 01/20/16 1656    PT LONG TERM GOAL #1   Title Patient will demonstrate the ability to perform HEP independently in order to maintain strength and endurance gains in physical therapy to improve quality of life.   Time 6   Period Weeks   Status New   PT LONG TERM GOAL #2   Title Patient will decrease 5 times sit to stand <14 seconds in order to decrease her risk for falls and move safely in her home.   Time 6   Period Weeks   Status New   PT LONG TERM GOAL #3   Title Patient will increase her 10 meter walk time to 1.0 in order to decrease her  risk for falls and perform ADLs safely.    Time 6   Period Weeks   Status New   PT LONG TERM GOAL #4   Title Patient will increase her overall strength to 4+/5 in UE and LE to allow the patient to perform gait and transfers more easily with decreased fall risk.   Time 6   Period Weeks   Status New   PT LONG TERM GOAL #5   Title Patient will improve Berg Balance score to >42 to increase patients mobility and decrease her risk for falls in the home with ADLs.    Time 6   Period Weeks   Status New               Plan - 02/13/16 1617    Clinical Impression Statement Patient is limited by fatigue throughout session due to low endurance level. Patient requires demonstration of activities and min VCs to perform therex and neuromuscular education with appropriate form or with safe habits. Patient requires min-mod tactile assistance with neuromuscular activities in order to maintain stability. She has improved coordination with her RUE today compared to previous sessions. Patient would continue to benefit from skilled PT in order to address fatigue, balance, LE/UE weakenss, and safety.   Rehab Potential Fair   Clinical Impairments Affecting Rehab Potential Positive factors: motivated,  Negative factors: multiple falls, previous stroke, speech difficulties Clinical presentation: evolving due to being a high fall risk and has low physical functioning;    PT Frequency 2x / week   PT Duration 6 weeks   PT Treatment/Interventions ADLs/Self Care Home Management;Aquatic Therapy;Electrical Stimulation;Moist Heat;DME Instruction;Gait training;Stair training;Functional mobility training;Therapeutic activities;Therapeutic exercise;Balance training;Neuromuscular re-education;Patient/family education;Manual techniques;Energy conservation;Cryotherapy;Orthotic Fit/Training   PT Next Visit Plan Address balance, strengthening, coordination   PT Home Exercise Plan HEP maintained   Consulted and Agree with Plan  of Care Patient;Family member/caregiver      Patient will benefit from skilled therapeutic intervention in order to improve the following deficits and impairments:  Decreased activity tolerance, Decreased balance, Decreased coordination, Decreased endurance, Decreased mobility, Decreased safety awareness, Decreased strength, Impaired UE functional use, Pain, Postural dysfunction, Improper body mechanics, Abnormal gait  Visit Diagnosis: Unsteadiness on feet  Muscle weakness (generalized)  Unspecified lack of coordination     Problem List Patient Active Problem List   Diagnosis Date Noted  . SINUSITIS- ACUTE-NOS 07/24/2008  . ALLERGIC RHINITIS 07/24/2008  . ACTINIC KERATOSIS 02/23/2008  . GASTROENTERITIS 09/08/2007  . CONTUSION, LOWER LEG 03/08/2007  . LUMBOSACRAL STRAIN 03/01/2007  . DEPRESSION 01-12-2007  . HYPERTENSION 01-12-2007  . DIVERTICULOSIS, COLON 01-12-2007  . CONSTIPATION 01-12-2007  . SEBORRHEA 01-12-2007  .  OSTEOARTHRITIS 2020/10/506  . OSTEOPOROSIS 2020/10/506   Tilman Neat, SPT This entire session was performed under direct supervision and direction of a licensed therapist/therapist assistant . I have personally read, edited and approve of the note as written.  Trotter,Margaret  PT, DPT  02/13/2016, 4:29 PM  [place] MAIN [facility] SERVICES [address] [place], [place], [address] Phone: [phone]   Fax:  [phone]  Name: Latoya Morris MRN: [id number] Date of Birth: 12/09/1933

## 2016-02-18 ENCOUNTER — Encounter: Payer: Self-pay | Admitting: Physical Therapy

## 2016-02-18 ENCOUNTER — Ambulatory Visit: Payer: Medicare Other | Admitting: Physical Therapy

## 2016-02-18 DIAGNOSIS — R2681 Unsteadiness on feet: Secondary | ICD-10-CM

## 2016-02-18 DIAGNOSIS — M6281 Muscle weakness (generalized): Secondary | ICD-10-CM

## 2016-02-18 DIAGNOSIS — R279 Unspecified lack of coordination: Secondary | ICD-10-CM

## 2016-02-18 NOTE — Patient Instructions (Addendum)
      Bracing With Bridging (Hook-Lying)    With neutral spine, tighten abdominals and hold. Lift bottom. Repeat _10__ times. Do _2__ times a day.   Copyright  VHI. All rights reserved.  HIP / KNEE: Flexion - Standing    Raise knee to chest. Keep back straight. Perform slowly. _10__ reps per set, __2_ sets per day, _5__ days per week Hold onto a support.  Copyright  VHI. All rights reserved.  Tandem Stance    Right foot in front of left, heel touching toe both feet "straight ahead". Stand on Foot Triangle of Support with both feet. Balance in this position _10__ seconds. Do with left foot in front of right.  Copyright  VHI. All rights reserved.  Heel Raises    Stand with support. Tighten pelvic floor and hold. With knees straight, raise heels off ground. Hold _1__ seconds. Relax for _2__ seconds. Repeat _10__ times. Do _2__ times a day.  Copyright  VHI. All rights reserved.

## 2016-02-18 NOTE — Therapy (Signed)
[place] MAIN [facility] SERVICES [address] [place], [place], [address] Phone: [phone]   Fax:  [phone]  Physical Therapy Treatment  Patient Details  Name: Latoya Morris MRN: [id number] Date of Birth: 05-Jan-1934 Referring Provider: Dr. Melrose Nakayama  Encounter Date: 02/18/2016      PT End of Session - 02/18/16 1747    Visit Number 7   Number of Visits 13   Date for PT Re-Evaluation 03/02/16   Authorization Type G Code 7   Authorization Time Period 10   PT Start Time 1648   PT Stop Time 1733   PT Time Calculation (min) 45 min   Equipment Utilized During Treatment Gait belt   Activity Tolerance Patient tolerated treatment well;Patient limited by fatigue   Behavior During Therapy [facility] for tasks assessed/performed      Past Medical History  Diagnosis Date  . Anxiety   . Depression   . Stroke [facility])     Doesn't remember year of stroke other than it not being recent    Past Surgical History  Procedure Laterality Date  . Tubal ligation      There were no vitals filed for this visit.      Subjective Assessment - 02/18/16 1652    Subjective Pt reports feeling better today but not quite as good as tomorrow. She reports she has not had any falls or near falls. She reports that she was able perform HEP.    Pertinent History Pertinent Factors that Affect Rehab Potential: limited transportation, previous stroke, falls, speech difficulties    Limitations Walking;Standing;Sitting   How long can you sit comfortably? N/A   How long can you stand comfortably? Few minutes, which is limited because of weakness   How long can you walk comfortably? 200 feet   Patient Stated Goals walk better,    Currently in Pain? No/denies      Treatment:  Supine bridges, 2 x10, last rep to 7 due to leg cramp, min VCs to contract gluts and increase foot width in order to increase stability.  Tandem stance 2 x 15 seconds, each lower extremity leading, min  assist to maintain balance, needs HHA to get into position not once in position  Tandem stance 2x5, each lower extremity leading with transverse ball hands, min assist to maintain balance, VCs to increase trunk rotation  Sit to stands, x10, no UE support  1 LE on blue dynadisc, 1 LE on firm surface, x 2, each LE leading, added ball tosses, required min assist with assistance to regain balance x 2.   Instructed to walk with dual task  Motor-alternating finger taps x 1 pass in hallway, multiple shifts from midline, min assist for stability, decreased walking speed Cognitive-x 2 passes in hallways, pointing out red/black playing cards with head turns while walking, easier than dual motor task  Heel/toe raises on airex pad, x10, no HHA, very difficult, min assist to maintain stability Heel/toe raises on firm surface, x10, no HHA, able to perform through more range  LE step overs on 4" step, x7 instructed to ascend with LLE and descend with RLE since RLE is slightly weaker, no HHA LE step ups on 4" step, x5 each LE, instruction to stand tall and contract glut muscles, no HHA  Standing hip flexion against red tband, x20 reps total, BLE, 2 HHA, instruction to improve posture instead of looking down at feet.  Picking up objects from the floor x 4, VCs to increase base of  support and bend at the knees not just at the back.                                PT Education - 02/18/16 1745    Education provided Yes   Education Details HEP progression   Person(s) Educated Patient   Methods Explanation;Demonstration;Verbal cues   Comprehension Verbalized understanding             PT Long Term Goals - 01/20/16 1656    PT LONG TERM GOAL #1   Title Patient will demonstrate the ability to perform HEP independently in order to maintain strength and endurance gains in physical therapy to improve quality of life.   Time 6   Period Weeks   Status New   PT LONG TERM GOAL #2    Title Patient will decrease 5 times sit to stand <14 seconds in order to decrease her risk for falls and move safely in her home.   Time 6   Period Weeks   Status New   PT LONG TERM GOAL #3   Title Patient will increase her 10 meter walk time to 1.0 in order to decrease her risk for falls and perform ADLs safely.    Time 6   Period Weeks   Status New   PT LONG TERM GOAL #4   Title Patient will increase her overall strength to 4+/5 in UE and LE to allow the patient to perform gait and transfers more easily with decreased fall risk.   Time 6   Period Weeks   Status New   PT LONG TERM GOAL #5   Title Patient will improve Berg Balance score to >42 to increase patients mobility and decrease her risk for falls in the home with ADLs.    Time 6   Period Weeks   Status New               Plan - 02/18/16 1747    Clinical Impression Statement Patient is limited by fatigue and has shortness of breath today with increase distance walking. She requires verbal and tactile cues in order to perform exercises with appropriate form. Patient instructed to perform balance HEP exercises against a corner of a wall with a chair in front in order to maintain safety while practicing.  Patient requires min assist during balance activities to maintain stability with several shifts from midline with 1 LE on dynadisc and basketball throws.  Patient has improved sit to stand and was able to perform from normal chair height with no UE x 10. Patient would continue to benefit from skilled PT in order to address LE weakness, safety, fatigue, balance, and re-intergrate her into an active community.    Rehab Potential Fair   Clinical Impairments Affecting Rehab Potential Positive factors: motivated,  Negative factors: multiple falls, previous stroke, speech difficulties Clinical presentation: evolving due to being a high fall risk and has low physical functioning;    PT Frequency 2x / week   PT Duration 6 weeks   PT  Treatment/Interventions ADLs/Self Care Home Management;Aquatic Therapy;Electrical Stimulation;Moist Heat;DME Instruction;Gait training;Stair training;Functional mobility training;Therapeutic activities;Therapeutic exercise;Balance training;Neuromuscular re-education;Patient/family education;Manual techniques;Energy conservation;Cryotherapy;Orthotic Fit/Training   PT Next Visit Plan Address balance, strengthening, coordination, walking bouts to increase activity tolerance   PT Home Exercise Plan HEP progressed   Consulted and Agree with Plan of Care Patient;Family member/caregiver      Patient will benefit from skilled therapeutic  intervention in order to improve the following deficits and impairments:  Decreased activity tolerance, Decreased balance, Decreased coordination, Decreased endurance, Decreased mobility, Decreased safety awareness, Decreased strength, Impaired UE functional use, Pain, Postural dysfunction, Improper body mechanics, Abnormal gait  Visit Diagnosis: Unsteadiness on feet  Muscle weakness (generalized)  Unspecified lack of coordination     Problem List Patient Active Problem List   Diagnosis Date Noted  . SINUSITIS- ACUTE-NOS 07/24/2008  . ALLERGIC RHINITIS 07/24/2008  . ACTINIC KERATOSIS 02/23/2008  . GASTROENTERITIS 09/08/2007  . CONTUSION, LOWER LEG 03/08/2007  . LUMBOSACRAL STRAIN 03/01/2007  . DEPRESSION Dec 17, 202008  . HYPERTENSION Dec 17, 202008  . DIVERTICULOSIS, COLON Dec 17, 202008  . CONSTIPATION Dec 17, 202008  . SEBORRHEA Dec 17, 202008  . OSTEOARTHRITIS Dec 17, 202008  . OSTEOPOROSIS Dec 17, 202008   Tilman Neat, SPT This entire session was performed under direct supervision and direction of a licensed therapist/therapist assistant . I have personally read, edited and approve of the note as written.  Trotter,Margaret  PT, DPT  02/19/2016, 8:30 AM  [place] MAIN [facility] SERVICES [address] [place], [place], [address] Phone:  [phone]   Fax:  [phone]  Name: Latoya Morris MRN: [id number] Date of Birth: [birth year]

## 2016-02-20 ENCOUNTER — Ambulatory Visit: Payer: Medicare Other | Admitting: Physical Therapy

## 2016-02-20 ENCOUNTER — Encounter: Payer: Self-pay | Admitting: Physical Therapy

## 2016-02-20 DIAGNOSIS — M6281 Muscle weakness (generalized): Secondary | ICD-10-CM

## 2016-02-20 DIAGNOSIS — R279 Unspecified lack of coordination: Secondary | ICD-10-CM

## 2016-02-20 DIAGNOSIS — R2681 Unsteadiness on feet: Secondary | ICD-10-CM

## 2016-02-20 NOTE — Therapy (Signed)
[place] [facility] [address] [place], [place], [address] Phone: [phone]   Fax:  [phone]  Physical Therapy Treatment  Patient Details  Name: Latoya Morris MRN: [id number] Date of Birth: 06/11/1934 Referring Provider: Dr. Malvin JohnsPotter  Encounter Date: 02/20/2016      PT End of Session - 02/20/16 1002    Visit Number 8   Number of Visits 13   Date for PT Re-Evaluation 03/02/16   Authorization Type G Code 8   Authorization Time Period 10   PT Start Time 0902   PT Stop Time 0945   PT Time Calculation (min) 43 min   Equipment Utilized During Treatment Gait belt   Activity Tolerance Patient tolerated treatment well;Patient limited by fatigue   [facility] for tasks assessed/performed      Past Medical History  Diagnosis Date  . Anxiety   . Depression   . Stroke [facility])     Doesn't remember year of stroke other than it not being recent    Past Surgical History  Procedure Laterality Date  . Tubal ligation      There were no vitals filed for this visit.      Subjective Assessment - 02/20/16 0905    Subjective Pt reports feeling better today but still noting some fatigue. She reports she has not had any falls or near falls. She reports that she has not been able  to do HEP since previous PT session.    Pertinent History Pertinent Factors that Affect Rehab Potential: limited transportation, previous stroke, falls, speech difficulties    Limitations Walking;Standing;Sitting   How long can you sit comfortably? N/A   How long can you stand comfortably? Few minutes, which is limited because of weakness   How long can you walk comfortably? 200 feet   Patient Stated Goals walk better,    Currently in Pain? No/denies      Treatment:  Warm up on NuStep, L3 x  3.5 min., BUE/LE, history taken during warm up (2 minutes unbilled) Re-educated on HEP: Bridges, 2 x 10, min VCs to increase distance of feet to  increase stability, minor cramping in LE during exercise, VCs to decrease knee flexion, no cramping afterwards Standing hip flexion, x 10 each LE, 1 HHA Tandem stance, x 15 seconds each LE leading, increased difficulty with L foot leading, min. VCs on how to perform safely at home Heel raises, 2 x 10, min. VCs to perform at controlled rate  Sit to stands with UE flexion of 3# bar weight, x 10 [facility] retrieval, walking back to start and stacking cones, 3 cones x 2, CGA-min assist for stability, VCs to perform squat with increase base of support Static standing, feet together on blue airex pad, 2 x 30 seconds, CGA for safety, very difficult Marches on blue airex pad, feet together, 3x5, none and 1 HHA depending on set, min assist to regain balance, very difficult Static stand feet apart on airex pad, bouncing ball and catching the return, x 5, min assist for stability  Dual task walking with light ball tosses, x 4 passes at // bar length, much slower walking speed  Walking no dual task x 2 passes, increased fatigue afterwards Backwards walking x4 passes, mod VCs to increase trunk flexion to improve balance of weight and to increase step length, min assist to maintain stability.  Lateral walks with red theraband, 2 x 10, mod VCs to keep feet facing forward and to  increase distance of step, min assist to regain stability.                            PT Education - 02/20/16 1001    Education provided Yes   Education Details hydration, Re-educated on HEP, fatigue level   Person(s) Educated Patient   Methods Explanation;Demonstration;Verbal cues   Comprehension Verbal cues required;Verbalized understanding;Returned demonstration             PT Long Term Goals - 01/20/16 1656    PT LONG TERM GOAL #1   Title Patient will demonstrate the ability to perform HEP independently in order to maintain strength and endurance gains in physical therapy to improve quality of life.    Time 6   Period Weeks   Status New   PT LONG TERM GOAL #2   Title Patient will decrease 5 times sit to stand <14 seconds in order to decrease her risk for falls and move safely in her home.   Time 6   Period Weeks   Status New   PT LONG TERM GOAL #3   Title Patient will increase her 10 meter walk time to 1.0 in order to decrease her risk for falls and perform ADLs safely.    Time 6   Period Weeks   Status New   PT LONG TERM GOAL #4   Title Patient will increase her overall strength to 4+/5 in UE and LE to allow the patient to perform gait and transfers more easily with decreased fall risk.   Time 6   Period Weeks   Status New   PT LONG TERM GOAL #5   Title Patient will improve Berg Balance score to >42 to increase patients mobility and decrease her risk for falls in the home with ADLs.    Time 6   Period Weeks   Status New               Plan - 02/20/16 1003    Clinical Impression Statement Patient continues to be limited by fatigue; however today she requires less breaks than previous therapy sessions. With longer duration walking events, patient has increased fatigue with shortness of breath. Patient has increased difficulties standing on blue airex foam with feet together and performing lateral band walking with red tband when encouraged to take larger steps. Patient has general weakness and low endurance level which contirbutes to her need for breaks more often. She requies min assist to maintain balance during neuromuscular training and min-mod VCs to perform exercises with appropriate technique.  Patient would continue to benefit from skilled PT in order to address balance, safety, endurance level, and increase community level.   Rehab Potential Fair   Clinical Impairments Affecting Rehab Potential Positive factors: motivated,  Negative factors: multiple falls, previous stroke, speech difficulties Clinical presentation: evolving due to being a high fall risk and has low  physical functioning;    PT Frequency 2x / week   PT Duration 6 weeks   PT Treatment/Interventions ADLs/Self Care Home Management;Aquatic Therapy;Electrical Stimulation;Moist Heat;DME Instruction;Gait training;Stair training;Functional mobility training;Therapeutic activities;Therapeutic exercise;Balance training;Neuromuscular re-education;Patient/family education;Manual techniques;Energy conservation;Cryotherapy;Orthotic Fit/Training   PT Next Visit Plan Address balance, strengthening, coordination, walking bouts to increase activity tolerance   PT Home Exercise Plan continue as given   Consulted and Agree with Plan of Care Patient;Family member/caregiver      Patient will benefit from skilled therapeutic intervention in order to improve the following deficits and  impairments:  Decreased activity tolerance, Decreased balance, Decreased coordination, Decreased endurance, Decreased mobility, Decreased safety awareness, Decreased strength, Impaired UE functional use, Pain, Postural dysfunction, Improper body mechanics, Abnormal gait  Visit Diagnosis: Unsteadiness on feet  Muscle weakness (generalized)  Unspecified lack of coordination     Problem List Patient Active Problem List   Diagnosis Date Noted  . SINUSITIS- ACUTE-NOS 07/24/2008  . ALLERGIC RHINITIS 07/24/2008  . ACTINIC KERATOSIS 02/23/2008  . GASTROENTERITIS 09/08/2007  . CONTUSION, LOWER LEG 03/08/2007  . LUMBOSACRAL STRAIN 03/01/2007  . DEPRESSION 05-02-2007  . HYPERTENSION 05-02-2007  . DIVERTICULOSIS, COLON 05-02-2007  . CONSTIPATION 05-02-2007  . SEBORRHEA 05-02-2007  . OSTEOARTHRITIS 05-02-2007  . OSTEOPOROSIS 05-02-2007   Tilman Neat, SPT This entire session was performed under direct supervision and direction of a licensed therapist/therapist assistant . I have personally read, edited and approve of the note as written.  Trotter,Margaret PT, DPT 02/20/2016, 10:20 AM  [place] MAIN [facility] SERVICES [address] [place], [place], [address] Phone: [phone]   Fax:  [phone]  Name: Latoya Morris MRN: [id number] Date of Birth: 08-09-1934

## 2016-02-25 ENCOUNTER — Encounter: Payer: Self-pay | Admitting: Physical Therapy

## 2016-02-25 ENCOUNTER — Ambulatory Visit: Payer: Medicare Other | Admitting: Physical Therapy

## 2016-02-25 DIAGNOSIS — R2681 Unsteadiness on feet: Secondary | ICD-10-CM

## 2016-02-25 DIAGNOSIS — R279 Unspecified lack of coordination: Secondary | ICD-10-CM

## 2016-02-25 DIAGNOSIS — M6281 Muscle weakness (generalized): Secondary | ICD-10-CM

## 2016-02-25 NOTE — Patient Instructions (Signed)
ABDUCTION: Sitting - Resistance Band (Active)    Sit with feet flat. Lift right leg slightly and, against yellow resistance band, draw it out to side. Complete __3_ sets of _10__ repetitions. Perform __1_ sessions per day.  Copyright  VHI. All rights reserved.  FLEXION: Sitting - Resistance Band (Active)    Sit, both feet flat. Against yellow resistance band, lift right knee toward ceiling. Complete __3_ sets of _10__ repetitions. Perform _1__ sessions per day.  http://gtsc.exer.us/21   Copyright  VHI. All rights reserved.

## 2016-02-26 NOTE — Therapy (Signed)
[place] MAIN [facility] SERVICES [address] [place], [place], [address] Phone: [phone]   Fax:  [phone]  Physical Therapy Treatment  Patient Details  Name: Latoya Morris MRN: [id number] Date of Birth: January 29, 1934 Referring Provider: Dr. Melrose Nakayama  Encounter Date: 02/25/2016      PT End of Session - 02/25/16 1736    Visit Number 9   Number of Visits 13   Date for PT Re-Evaluation 03/02/16   Authorization Type G Code 9   Authorization Time Period 10   PT Start Time [id number]   PT Stop Time 1731   PT Time Calculation (min) 44 min   Equipment Utilized During Treatment Gait belt   Activity Tolerance Patient tolerated treatment well;Patient limited by fatigue   Behavior During Therapy [facility] for tasks assessed/performed      Past Medical History  Diagnosis Date  . Anxiety   . Depression   . Stroke [facility])     Doesn't remember year of stroke other than it not being recent    Past Surgical History  Procedure Laterality Date  . Tubal ligation      There were no vitals filed for this visit.      Subjective Assessment - 02/25/16 1653    Subjective Pt reports feeling fatigued today due to being out during the day. She reports she has not had any falls, but reports near falls. She reports that she has not been able  to HEP in bed but has performed the other exercises.    Pertinent History Pertinent Factors that Affect Rehab Potential: limited transportation, previous stroke, falls, speech difficulties    Limitations Walking;Standing;Sitting   How long can you sit comfortably? N/A   How long can you stand comfortably? Few minutes, which is limited because of weakness   How long can you walk comfortably? 200 feet   Patient Stated Goals walk better,    Currently in Pain? No/denies     Treatment:  Warm up on Nustep, x3 minutes, BUE/LE, L3, history taken throughout warm up  Sit to stands, no UE use, x 10, min VCs to control eccentric  movement  Dynamic lateral walking on airex balance beam, x2 passes in // bars, discontinued due to patient requiring mod assist due to inappropriate shoes for therapy.   Tandem stance on/off the airex pad, min-mod assist to maintain stability, no UE support, increased difficulty on airex pad, 2 x 30 seconds each LE leading.  Standing 4 way hip except hip adduction, yellow tband resistance, x10 each direction, 2 x10 into hip abduction with multiple VCs for improved form, min assist for stability with transiting directions. Patient shown how to perform this in the corner with chair in front of her for safety, but due to min assist and mild safety issues with transitions in between exercises, not given for HEP.   Picking up dots relay, x 2 with 3 dots, instructed to increase LE base of support, and bend at knees not just back to increase balance.  Seated hip abduction/flexion, red tband resistance, x10 each direction, patient able to don/doff tband independently when it is already tied. Multiple tries required to find how patient would be able to don/doff independently. Patient given tband for home use. Initiated in [place],  Handout given  Patient requires min-mod assist to maintain stability during exercises and during transitions in between exercises.  PT Education - 02/25/16 1735    Education provided Yes   Education Details hydration, progressed HEP, shoe wear, ice use   Person(s) Educated Patient   Methods Explanation;Tactile cues;Verbal cues   Comprehension Verbal cues required;Verbalized understanding;Returned demonstration             PT Long Term Goals - 01/20/16 1656    PT LONG TERM GOAL #1   Title Patient will demonstrate the ability to perform HEP independently in order to maintain strength and endurance gains in physical therapy to improve quality of life.   Time 6   Period Weeks   Status New   PT LONG TERM GOAL #2   Title  Patient will decrease 5 times sit to stand <14 seconds in order to decrease her risk for falls and move safely in her home.   Time 6   Period Weeks   Status New   PT LONG TERM GOAL #3   Title Patient will increase her 10 meter walk time to 1.0 in order to decrease her risk for falls and perform ADLs safely.    Time 6   Period Weeks   Status New   PT LONG TERM GOAL #4   Title Patient will increase her overall strength to 4+/5 in UE and LE to allow the patient to perform gait and transfers more easily with decreased fall risk.   Time 6   Period Weeks   Status New   PT LONG TERM GOAL #5   Title Patient will improve Berg Balance score to >42 to increase patients mobility and decrease her risk for falls in the home with ADLs.    Time 6   Period Weeks   Status New               Plan - 02/25/16 1736    Clinical Impression Statement Patient limited by fatigue especially since being out most of the day today. Patient becomes out of breath during walking and picking up objects and focusing on tying tband.  Patient is able to participate in therex and balance exercises requires min-mod tactile assist for stability, perform exercises appropriately, and transition between exercises. Patient instructed on 4 way hip (minus hip ADD) but would be home alone and demonstrates difficulties turning safely in order to perform safely and independently at home. Patient would continue to require PT in order to address weakness, limited endurance, balance, and address safety to decrease falls.   Rehab Potential Fair   Clinical Impairments Affecting Rehab Potential Positive factors: motivated,  Negative factors: multiple falls, previous stroke, speech difficulties Clinical presentation: evolving due to being a high fall risk and has low physical functioning;    PT Frequency 2x / week   PT Duration 6 weeks   PT Treatment/Interventions ADLs/Self Care Home Management;Aquatic Therapy;Electrical Stimulation;Moist  Heat;DME Instruction;Gait training;Stair training;Functional mobility training;Therapeutic activities;Therapeutic exercise;Balance training;Neuromuscular re-education;Patient/family education;Manual techniques;Energy conservation;Cryotherapy;Orthotic Fit/Training   PT Next Visit Plan Address balance, strengthening, coordination, walking bouts to increase activity tolerance   PT Home Exercise Plan HEP progressed   Consulted and Agree with Plan of Care Patient;Family member/caregiver      Patient will benefit from skilled therapeutic intervention in order to improve the following deficits and impairments:  Decreased activity tolerance, Decreased balance, Decreased coordination, Decreased endurance, Decreased mobility, Decreased safety awareness, Decreased strength, Impaired UE functional use, Pain, Postural dysfunction, Improper body mechanics, Abnormal gait  Visit Diagnosis: Unsteadiness on feet  Muscle weakness (generalized)  Unspecified lack of coordination  Problem List Patient Active Problem List   Diagnosis Date Noted  . SINUSITIS- ACUTE-NOS 07/24/2008  . ALLERGIC RHINITIS 07/24/2008  . ACTINIC KERATOSIS 02/23/2008  . GASTROENTERITIS 09/08/2007  . CONTUSION, LOWER LEG 03/08/2007  . LUMBOSACRAL STRAIN 03/01/2007  . DEPRESSION Apr 19, 202008  . HYPERTENSION Apr 19, 202008  . DIVERTICULOSIS, COLON Apr 19, 202008  . CONSTIPATION Apr 19, 202008  . SEBORRHEA Apr 19, 202008  . OSTEOARTHRITIS Apr 19, 202008  . OSTEOPOROSIS Apr 19, 202008   Tilman Neat, SPT This entire session was performed under direct supervision and direction of a licensed therapist/therapist assistant . I have personally read, edited and approve of the note as written.  Trotter,Margaret PT, DPT 02/26/2016, 9:00 AM  [place] MAIN [facility] SERVICES [address] [place], [place], [address] Phone: [phone]   Fax:  [phone]  Name: RARITY MCGOWEN MRN: [id number] Date of Birth:  February 16, 1934

## 2016-02-27 ENCOUNTER — Ambulatory Visit: Payer: Medicare Other | Admitting: Physical Therapy

## 2016-02-27 ENCOUNTER — Encounter: Payer: Self-pay | Admitting: Physical Therapy

## 2016-02-27 DIAGNOSIS — R2681 Unsteadiness on feet: Secondary | ICD-10-CM

## 2016-02-27 DIAGNOSIS — M6281 Muscle weakness (generalized): Secondary | ICD-10-CM

## 2016-02-27 DIAGNOSIS — R279 Unspecified lack of coordination: Secondary | ICD-10-CM

## 2016-02-27 NOTE — Therapy (Signed)
[place] MAIN [facility] SERVICES [address] [place], [place], [address] Phone: [phone]   Fax:  [phone]  Physical Therapy Treatment/Progress Note  Patient Details  Name: Latoya Morris MRN: [id number] Date of Birth: 06-Feb-1934 Referring Provider: Dr. Melrose Nakayama  Encounter Date: 02/27/2016      PT End of Session - 02/27/16 1033    Visit Number 10   Number of Visits 13   Date for PT Re-Evaluation 03/02/16   Authorization Type G Code 10   Authorization Time Period 10   PT Start Time [id number]   PT Stop Time 1020   PT Time Calculation (min) 42 min   Equipment Utilized During Treatment Gait belt   Activity Tolerance Patient tolerated treatment well;Patient limited by fatigue   Behavior During Therapy [facility] for tasks assessed/performed      Past Medical History  Diagnosis Date  . Anxiety   . Depression   . Stroke [facility])     Doesn't remember year of stroke other than it not being recent    Past Surgical History  Procedure Laterality Date  . Tubal ligation      There were no vitals filed for this visit.      Subjective Assessment - 02/27/16 0940    Subjective Pt reports feeling better today. She states that she is concerned about continuing therapy due to patient's limited transportation.     Pertinent History Pertinent Factors that Affect Rehab Potential: limited transportation, previous stroke, falls, speech difficulties    Limitations Walking;Standing;Sitting   How long can you sit comfortably? N/A   How long can you stand comfortably? Few minutes, which is limited because of weakness   How long can you walk comfortably? 200 feet   Patient Stated Goals walk better,    Currently in Pain? No/denies            [facility] PT Assessment - 02/27/16 0001    Observation/Other Assessments   Activities of Balance Confidence Scale (ABC Scale)  23.1%, low level of physical functioning   Strength   Right Shoulder ABduction 4-/5   Left  Shoulder ABduction 4/5   Right Hip Flexion 3+/5   Right Hip ABduction 4/5  seated   Right Hip ADduction 4/5  seated   Left Hip Flexion 4/5   Left Hip ABduction 4/5  seated   Left Hip ADduction 4/5  seated   Right Knee Flexion 4-/5   Right Knee Extension 4+/5   Left Knee Flexion 4/5   Left Knee Extension 5/5   Right Ankle Dorsiflexion 5/5   Left Ankle Dorsiflexion 5/5   Standardized Balance Assessment   Five times sit to stand comments  19.0 seconds, no UE use, improved since eval 01/20/16 from 28.8 seconds, > 3 y.o. >14 seconds indicates increased falls risk   10 Meter Walk 0.73 m/s, no AD, improved from eval 01/20/16 from 0.65 m/s, indicates patient needs interventions to reduce falls   Berg Balance Test   Sit to Stand Able to stand without using hands and stabilize independently   Standing Unsupported Able to stand safely 2 minutes   Sitting with Back Unsupported but Feet Supported on Floor or Stool Able to sit safely and securely 2 minutes   Stand to Sit Sits safely with minimal use of hands   Transfers Able to transfer safely, minor use of hands   Standing Unsupported with Eyes Closed Able to stand 10 seconds safely   Standing Ubsupported with Feet Together Able to  place feet together independently and stand 1 minute safely   From Standing, Reach Forward with Outstretched Arm Can reach forward >12 cm safely (5")   From Standing Position, Pick up Object from [place] to pick up shoe, needs supervision   From Standing Position, Turn to Look Behind Over each Shoulder Looks behind one side only/other side shows less weight shift   Turn 360 Degrees Able to turn 360 degrees safely one side only in 4 seconds or less   Standing Unsupported, Alternately Place Feet on Step/Stool Able to stand independently and complete 8 steps >20 seconds   Standing Unsupported, One Foot in [place] to take small step independently and hold 30 seconds   Standing on One Leg Able to lift leg  independently and hold equal to or more than 3 seconds   Total Score 47   Berg comment: >45/56 indicates maye at decreased risk for falls, improved from 40/56 on eval 01/20/16      Treatment:  Outcome measures and goals assessed for re-eval.  Patient instructed to walk to fitness gym, rest, and back after attempting bike activities, requires CGA for safety during ambulation.    NuStep bike, x 1 min. Supervision to get on/off, min VCs to sit bottom first and then swing legs in to increase safety. Recumbent bike, x2 min., min assist to get on/off with R LE, mod VCs to dismount towards R for ease.   Patient and daughter educated on mounting/dismounting bike and to supervise until patient is completely safe alone. Instructed to perform for 2 min. Rest for 2 min. On lowest resistance level.                     PT Education - 02/27/16 1033    Education provided Yes   Education Details using bike at home, improvements   Person(s) Educated Patient;Child(ren)   Methods Explanation;Verbal cues;Demonstration   Comprehension Verbalized understanding;Returned demonstration;Verbal cues required             PT Long Term Goals - 02/27/16 1034    PT LONG TERM GOAL #1   Title Patient will demonstrate the ability to perform HEP independently in order to maintain strength and endurance gains in physical therapy to improve quality of life.   Time 4   Period Weeks   Status On-going   PT LONG TERM GOAL #2   Title Patient will decrease 5 times sit to stand <14 seconds in order to decrease her risk for falls and move safely in her home.   Time 4   Period Weeks   Status Partially Met   PT LONG TERM GOAL #3   Title Patient will increase her 10 meter walk time to 1.0 in order to decrease her risk for falls and perform ADLs safely.    Time 4   Period Weeks   Status Partially Met   PT LONG TERM GOAL #4   Title Patient will increase her overall strength to 4+/5 in UE and LE to allow  the patient to perform gait and transfers more easily with decreased fall risk.   Time 4   Period Weeks   Status Partially Met   PT LONG TERM GOAL #5   Title Patient will improve Berg Balance score to >42 to increase patients mobility and decrease her risk for falls in the home with ADLs.    Time 6   Period Weeks   Status Achieved  Plan - 03/21/2016 1035    Clinical Impression Statement Patient's goals and outcomes measures were re-assessed. Patient is progressing towards her goals and has improvement in strength, balance testing, and outcome measures. Patient continues to have perceived difficulty with falls based on ABC score decreasing. Patient required CGA to supervision to maintain safety. Patient instructed to perform home bike with daughter's supervision in 2 min bouts on/off time at easiest level. Patient requires VCs to get on/off bike, but is able to perform. Requires min assist when bike has a high middle, but states that this is not similar to the bike at home. Patient would continue to benefit from skilled PT to decrease risk for falls, increase safety awareness, strength, and improve endurance.   Rehab Potential Fair   Clinical Impairments Affecting Rehab Potential Positive factors: motivated,  Negative factors: multiple falls, previous stroke, speech difficulties Clinical presentation: evolving due to being a high fall risk and has low physical functioning;    PT Frequency 2x / week   PT Duration 4 weeks   PT Treatment/Interventions ADLs/Self Care Home Management;Aquatic Therapy;Electrical Stimulation;Moist Heat;DME Instruction;Gait training;Stair training;Functional mobility training;Therapeutic activities;Therapeutic exercise;Balance training;Neuromuscular re-education;Patient/family education;Manual techniques;Energy conservation;Cryotherapy;Orthotic Fit/Training   PT Next Visit Plan Address balance, strengthening, coordination, walking bouts to increase  activity tolerance, continue HEP progression   PT Home Exercise Plan continue HEP and initiated bike at home with daughter's supervision   Consulted and Agree with Plan of Care Patient;Family member/caregiver      Patient will benefit from skilled therapeutic intervention in order to improve the following deficits and impairments:  Decreased activity tolerance, Decreased balance, Decreased coordination, Decreased endurance, Decreased mobility, Decreased safety awareness, Decreased strength, Impaired UE functional use, Pain, Postural dysfunction, Improper body mechanics, Abnormal gait  Visit Diagnosis: Unsteadiness on feet  Muscle weakness (generalized)  Unspecified lack of coordination       G-Codes - 2016/03/21 1052    Functional Assessment Tool Used 10 meter walk, 5 times sit<>Stand, clinical judgement; has potential to make additional improvement;    Functional Limitation Mobility: Walking and moving around   Mobility: Walking and Moving Around Current Status [phone]) At least 20 percent but less than 40 percent impaired, limited or restricted   Mobility: Walking and Moving Around Goal Status [phone]) At least 20 percent but less than 40 percent impaired, limited or restricted      Problem List Patient Active Problem List   Diagnosis Date Noted  . SINUSITIS- ACUTE-NOS 07/24/2008  . ALLERGIC RHINITIS 07/24/2008  . ACTINIC KERATOSIS 02/23/2008  . GASTROENTERITIS 09/08/2007  . CONTUSION, LOWER LEG 03/08/2007  . LUMBOSACRAL STRAIN 03/01/2007  . DEPRESSION 2020-03-2107  . HYPERTENSION 2020-03-2107  . DIVERTICULOSIS, COLON 2020-03-2107  . CONSTIPATION 2020-03-2107  . SEBORRHEA 2020-03-2107  . OSTEOARTHRITIS 2020-03-2107  . OSTEOPOROSIS 2020-03-2107   Tilman Neat, SPT This entire session was performed under direct supervision and direction of a licensed therapist/therapist assistant . I have personally read, edited and approve of the note as written.  Trotter,Margaret PT, DPT 2016/03/21,  10:53 AM  [place] MAIN [facility] SERVICES [address] [place], [place], [address] Phone: [phone]   Fax:  [phone]  Name: Latoya Morris MRN: [id number] Date of Birth: Jan 26, 1934

## 2016-03-03 ENCOUNTER — Encounter: Payer: Self-pay | Admitting: Physical Therapy

## 2016-03-03 ENCOUNTER — Ambulatory Visit: Payer: Medicare Other | Admitting: Physical Therapy

## 2016-03-03 DIAGNOSIS — R2681 Unsteadiness on feet: Secondary | ICD-10-CM | POA: Diagnosis not present

## 2016-03-03 DIAGNOSIS — M6281 Muscle weakness (generalized): Secondary | ICD-10-CM

## 2016-03-03 DIAGNOSIS — R279 Unspecified lack of coordination: Secondary | ICD-10-CM

## 2016-03-03 NOTE — Patient Instructions (Signed)
Walking around therapy gym ~100 feet, CGA for safety, min VCs to swing arm   Dynamic balance, heel toe walking on balance beam, lateral walking on balance beam  Heel toe walking firm surface  Marches with 1.5#, 2 x10, 1 HHA to finger tip assist  [place] taps, standing on 2 airex pads, x3 each LE,  Marches on 2 airex pads with no HAA, min assist, 2x10   Forward steps ups, x10, each LE leading, 1 HHA to no HHA, min VCs to eccentrically control downwards  Forward and sideways walking over dots, x4 passes in // bars, min VCs to step closer to dots before stepping over Used lines to decrease hip flexion required x2 and started with dots again, x2   Lateral walking with red tband, x4 passes, min VCs to increase step length  Sit to stands, x5, no UE use, close supervision for safety

## 2016-03-03 NOTE — Therapy (Signed)
[place] [facility] SERVICES [address] [place], [place], [address] Phone: [phone]   Fax:  [phone]  Physical Therapy Treatment  Patient Details  Name: Latoya Morris MRN: [id number] Date of Birth: May 23, 1934 Referring Provider: Dr. Melrose Nakayama  Encounter Date: 03/03/2016      PT End of Session - 03/03/16 1741    Visit Number 11   Number of Visits 21   Date for PT Re-Evaluation 03/26/16   Authorization Type G Code 11   Authorization Time Period 20   PT Start Time 1650   PT Stop Time 1732   PT Time Calculation (min) 42 min   Equipment Utilized During Treatment Gait belt   Activity Tolerance Patient tolerated treatment well;Patient limited by fatigue   Behavior During Therapy WFL for tasks assessed/performed      Past Medical History:  Diagnosis Date  . Anxiety   . Depression   . Stroke [facility])    Doesn't remember year of stroke other than it not being recent    Past Surgical History:  Procedure Laterality Date  . TUBAL LIGATION      There were no vitals filed for this visit.      Subjective Assessment - 03/03/16 1653    Subjective Pt reports feeling better today. She states that she is concerned about continuing therapy due to patient's limited transportation.     Pertinent History Pertinent Factors that Affect Rehab Potential: limited transportation, previous stroke, falls, speech difficulties    Limitations Walking;Standing;Sitting   How long can you sit comfortably? N/A   How long can you stand comfortably? Few minutes, which is limited because of weakness   How long can you walk comfortably? 200 feet   Patient Stated Goals walk better,    Currently in Pain? Yes   Pain Score 6    Pain Location Leg   Pain Orientation Left   Pain Descriptors / Indicators Sore   Pain Onset Yesterday       Treatment:  Instructed to walk around therapy gym with SPC, ~100 feet, CGA for safety, min VCs to swing R arm  comfortably  Dynamic balance: Heel toe walking on airex balance beam, x1,extremely difficult.  Decreased to heel toe walking on firm surface, 1 to no HHA, x4 passes in // bars. Min assist to maintain balance.  Lateral walking on airex balance beam, x4 passes in // bars, CGA for safety, no HHA.  Static tandem leg stance and feet apart stance on airex balance beam, 3x20 seconds each LE leading/1 rep of static feet apart, min assist for stability.   Marches with 1.5# ankle weights, 2 x10, 1 HHA to finger tip assist  Cone taps, standing on 2 airex pads, x3 each LE, min assist for safety Marches on 2 airex pads with no HAA, min assist, 2x10, VCs to perform greater hip flexion.  Forward steps ups on 4" step, x10, each LE leading, 1 HHA to no HHA, min VCs to eccentrically control downwards.  Forward and sideways walking over dots in // bars, 2 HHA, x4 passes in // bars, min assist for safety, Min-mod VCs to step closer to dots before stepping over. Lateral stepping over tape lines in // bars to decrease hip flexion required, x2 each LE leading, CGA, increased to dots again, x2 passes, min assist, min VCs to increase distance stepped.  Lateral walking with red tband resistance, x4 passes, 1 HHA, min VCs to increase step length; CGA  Sit to  stands, x5, no UE use, close supervision for safety                          PT Education - 03/03/16 1736    Education provided Yes   Education Details Taking days between using bike at home, exercises   Person(s) Educated Patient   Methods Explanation;Verbal cues;Demonstration   Comprehension Verbalized understanding;Verbal cues required;Returned demonstration             PT Long Term Goals - 02/27/16 1034      PT LONG TERM GOAL #1   Title Patient will demonstrate the ability to perform HEP independently in order to maintain strength and endurance gains in physical therapy to improve quality of life.   Time 4   Period  Weeks   Status On-going     PT LONG TERM GOAL #2   Title Patient will decrease 5 times sit to stand <14 seconds in order to decrease her risk for falls and move safely in her home.   Time 4   Period Weeks   Status Partially Met     PT LONG TERM GOAL #3   Title Patient will increase her 10 meter walk time to 1.0 in order to decrease her risk for falls and perform ADLs safely.    Time 4   Period Weeks   Status Partially Met     PT LONG TERM GOAL #4   Title Patient will increase her overall strength to 4+/5 in UE and LE to allow the patient to perform gait and transfers more easily with decreased fall risk.   Time 4   Period Weeks   Status Partially Met     PT LONG TERM GOAL #5   Title Patient will improve Berg Balance score to >42 to increase patients mobility and decrease her risk for falls in the home with ADLs.    Time 6   Period Weeks   Status Achieved               Plan - 03/03/16 1742    Clinical Impression Statement Paitent continues to be limited by fatigue requiring increased rest breaks after activities today. Patient is able to perform balance exercises with min assist for stability and limited UE support. During therex patient requires min-mod VCs to perform correctly and requires min assist for stability. Patient presents with soreness that she states is from using the bike at home. Instructed to perform with rests in between days and bouts and to perform with no resistanc level as well as scooting the chair backwards so not in full hip flexion. Patient would continue to benefit from skilled PT in order to address LE weakness, fatigue, and safety during functional movement.    Rehab Potential Fair   Clinical Impairments Affecting Rehab Potential Positive factors: motivated,  Negative factors: multiple falls, previous stroke, speech difficulties Clinical presentation: evolving due to being a high fall risk and has low physical functioning;    PT Frequency 2x / week    PT Duration 4 weeks   PT Treatment/Interventions ADLs/Self Care Home Management;Aquatic Therapy;Electrical Stimulation;Moist Heat;DME Instruction;Gait training;Stair training;Functional mobility training;Therapeutic activities;Therapeutic exercise;Balance training;Neuromuscular re-education;Patient/family education;Manual techniques;Energy conservation;Cryotherapy;Orthotic Fit/Training   PT Next Visit Plan Address balance, strengthening, coordination, walking bouts to increase activity tolerance, continue HEP progression   PT Home Exercise Plan continue HEP    Consulted and Agree with Plan of Care Patient;Family member/caregiver      Patient will  benefit from skilled therapeutic intervention in order to improve the following deficits and impairments:  Decreased activity tolerance, Decreased balance, Decreased coordination, Decreased endurance, Decreased mobility, Decreased safety awareness, Decreased strength, Impaired UE functional use, Pain, Postural dysfunction, Improper body mechanics, Abnormal gait  Visit Diagnosis: Unsteadiness on feet  Muscle weakness (generalized)  Unspecified lack of coordination     Problem List Patient Active Problem List   Diagnosis Date Noted  . SINUSITIS- ACUTE-NOS 07/24/2008  . ALLERGIC RHINITIS 07/24/2008  . ACTINIC KERATOSIS 02/23/2008  . GASTROENTERITIS 09/08/2007  . CONTUSION, LOWER LEG 03/08/2007  . LUMBOSACRAL STRAIN 03/01/2007  . DEPRESSION 10/11/202008  . HYPERTENSION 10/11/202008  . DIVERTICULOSIS, COLON 10/11/202008  . CONSTIPATION 10/11/202008  . SEBORRHEA 10/11/202008  . OSTEOARTHRITIS 10/11/202008  . OSTEOPOROSIS 10/11/202008   Tilman Neat, SPT This entire session was performed under direct supervision and direction of a licensed therapist/therapist assistant . I have personally read, edited and approve of the note as written.  Trotter,Margaret PT, DPT 03/04/2016, 8:59 AM   [place] [facility]  SERVICES [address] [place], [place], [address] Phone: [phone]   Fax:  [phone]  Name: CHARLAYNE VULTAGGIO MRN: [id number] Date of Birth: 1933/08/28

## 2016-03-05 ENCOUNTER — Encounter: Payer: Self-pay | Admitting: Physical Therapy

## 2016-03-05 ENCOUNTER — Ambulatory Visit: Payer: Medicare Other | Admitting: Physical Therapy

## 2016-03-05 DIAGNOSIS — R2681 Unsteadiness on feet: Secondary | ICD-10-CM | POA: Diagnosis not present

## 2016-03-05 DIAGNOSIS — M6281 Muscle weakness (generalized): Secondary | ICD-10-CM

## 2016-03-05 DIAGNOSIS — R279 Unspecified lack of coordination: Secondary | ICD-10-CM

## 2016-03-05 NOTE — Therapy (Addendum)
[place] [facility] SERVICES [address] [place], [place], [address] Phone: [phone]   Fax:  [phone]  Physical Therapy Treatment  Patient Details  Name: Latoya Morris MRN: [id number] Date of Birth: 05/03/1934 Referring Provider: Dr. Melrose Nakayama  Encounter Date: 03/05/2016      PT End of Session - 03/05/16 1036    Visit Number 12   Number of Visits 21   Date for PT Re-Evaluation 03/26/16   Authorization Type G Code 12   Authorization Time Period 20   PT Start Time 0933   PT Stop Time 1015   PT Time Calculation (min) 42 min   Equipment Utilized During Treatment Gait belt   Activity Tolerance Patient tolerated treatment well;Patient limited by fatigue   Behavior During Therapy [facility] for tasks assessed/performed      Past Medical History:  Diagnosis Date  . Anxiety   . Depression   . Stroke [facility])    Doesn't remember year of stroke other than it not being recent    Past Surgical History:  Procedure Laterality Date  . TUBAL LIGATION      There were no vitals filed for this visit.      Subjective Assessment - 03/05/16 0939    Subjective Pt reports feel good today.Patient reports she performs the seated HEP but not the standing or supine exercise she has been given   Pertinent History Pertinent Factors that Affect Rehab Potential: limited transportation, previous stroke, falls, speech difficulties    Limitations Walking;Standing;Sitting   How long can you sit comfortably? N/A   How long can you stand comfortably? Few minutes, which is limited because of weakness   How long can you walk comfortably? 200 feet   Patient Stated Goals walk better,    Currently in Pain? No/denies   Pain Onset Yesterday      Treatment:  Walking on treadmill, 1.0 x 26mn,. CGA for safety, min VCs to increase step length  TherEx: Squatting to dots, x6, CGA for safety, min VCs to increase base of support and squat at the knees  Stepping to  dots, x3 reps each LE Reactive stepping to dots, 2x8 each LE.   Min assist for safety, with reactive stepping mild imbalance  Sit<>stand with oblique ball movements in standing, x10 min VCs to increase eccentric control  LE leg press, 3 x10 at 75#,  Min VCs to control eccentrically  Neuromuscular Re-ed: Half bolster feet apart, static stance, x 45 seconds, no UE assist Half bolsters tilts forward/back, no UE use, x5 Min assist for stability   Semi tandem on airex pad, each LE leading x 8 Sabeo ball movements with RUE, CGA  Leading foot on dynadisc, x10 UE flexions with 1.5# bar weight, each LE leading,  Heel/toe walking, x2 passes in // bars, min assist, no UE support    Throughout treatment, patient is limited by fatigue requiring increased rests                            PT Education - 03/05/16 1036    Education provided Yes   Education Details Bike at home, sit to stands focusing on eccentric control   Person(s) Educated Patient   Methods Demonstration;Explanation;Verbal cues   Comprehension Verbal cues required;Verbalized understanding;Returned demonstration             PT Long Term Goals - 02/27/16 1034      PT LONG TERM GOAL #  1   Title Patient will demonstrate the ability to perform HEP independently in order to maintain strength and endurance gains in physical therapy to improve quality of life.   Time 4   Period Weeks   Status On-going     PT LONG TERM GOAL #2   Title Patient will decrease 5 times sit to stand <14 seconds in order to decrease her risk for falls and move safely in her home.   Time 4   Period Weeks   Status Partially Met     PT LONG TERM GOAL #3   Title Patient will increase her 10 meter walk time to 1.0 in order to decrease her risk for falls and perform ADLs safely.    Time 4   Period Weeks   Status Partially Met     PT LONG TERM GOAL #4   Title Patient will increase her overall strength to 4+/5 in UE and LE to  allow the patient to perform gait and transfers more easily with decreased fall risk.   Time 4   Period Weeks   Status Partially Met     PT LONG TERM GOAL #5   Title Patient will improve Berg Balance score to >42 to increase patients mobility and decrease her risk for falls in the home with ADLs.    Time 6   Period Weeks   Status Achieved               Plan - 03/05/16 1036    Clinical Impression Statement Again patient is limited by fatigue, although she does demonstrate less shortness of breath with fatigue today. Patient able to tolerate advancement with warm up on treadmill and LE leg press. Patient continues to requires CGA-min assist during balance exercises in order to maintain safety and requires  min VCs in order to address exercise techniques. Patient would continue to benefit from skilled PT in order to address weakness, fatigue, and safety during functional movements.    Rehab Potential Fair   Clinical Impairments Affecting Rehab Potential Positive factors: motivated,  Negative factors: multiple falls, previous stroke, speech difficulties Clinical presentation: evolving due to being a high fall risk and has low physical functioning;    PT Frequency 2x / week   PT Duration 4 weeks   PT Treatment/Interventions ADLs/Self Care Home Management;Aquatic Therapy;Electrical Stimulation;Moist Heat;DME Instruction;Gait training;Stair training;Functional mobility training;Therapeutic activities;Therapeutic exercise;Balance training;Neuromuscular re-education;Patient/family education;Manual techniques;Energy conservation;Cryotherapy;Orthotic Fit/Training   PT Next Visit Plan Address balance, strengthening, coordination, walking bouts to increase activity tolerance, continue HEP progression   PT Home Exercise Plan continue HEP as given   Consulted and Agree with Plan of Care Patient;Family member/caregiver      Patient will benefit from skilled therapeutic intervention in order to  improve the following deficits and impairments:  Decreased activity tolerance, Decreased balance, Decreased coordination, Decreased endurance, Decreased mobility, Decreased safety awareness, Decreased strength, Impaired UE functional use, Pain, Postural dysfunction, Improper body mechanics, Abnormal gait  Visit Diagnosis: Unsteadiness on feet  Muscle weakness (generalized)  Unspecified lack of coordination     Problem List Patient Active Problem List   Diagnosis Date Noted  . SINUSITIS- ACUTE-NOS 07/24/2008  . ALLERGIC RHINITIS 07/24/2008  . ACTINIC KERATOSIS 02/23/2008  . GASTROENTERITIS 09/08/2007  . CONTUSION, LOWER LEG 03/08/2007  . LUMBOSACRAL STRAIN 03/01/2007  . DEPRESSION September 13, 202008  . HYPERTENSION September 13, 202008  . DIVERTICULOSIS, COLON September 13, 202008  . CONSTIPATION September 13, 202008  . SEBORRHEA September 13, 202008  . OSTEOARTHRITIS September 13, 202008  . OSTEOPOROSIS September 13, 202008   Tilman Neat,  SPT This entire session was performed under direct supervision and direction of a licensed therapist/therapist assistant . I have personally read, edited and approve of the note as written.  Hillis Range, PT, DPT, [phone] 03/08/16 8:33 AM   [place] MAIN [facility] SERVICES [address] [place], [place], [address] Phone: [phone]   Fax:  [phone]  Name: MIRINDA MONTE MRN: [id number] Date of Birth: 1933/08/18

## 2016-03-10 ENCOUNTER — Ambulatory Visit: Payer: Medicare Other | Attending: Neurology | Admitting: Physical Therapy

## 2016-03-10 DIAGNOSIS — R279 Unspecified lack of coordination: Secondary | ICD-10-CM

## 2016-03-10 DIAGNOSIS — M6281 Muscle weakness (generalized): Secondary | ICD-10-CM | POA: Diagnosis present

## 2016-03-10 DIAGNOSIS — R2681 Unsteadiness on feet: Secondary | ICD-10-CM | POA: Diagnosis not present

## 2016-03-11 NOTE — Therapy (Signed)
[place] [facility] SERVICES [address] [place], [place], [address] Phone: [phone]   Fax:  [phone]  Physical Therapy Treatment  Patient Details  Name: Latoya Morris MRN: [id number] Date of Birth: Jan 04, 1934 Referring Provider: Dr. Melrose Nakayama  Encounter Date: 03/10/2016      PT End of Session - 03/11/16 0800    Visit Number 13   Number of Visits 21   Date for PT Re-Evaluation 03/26/16   Authorization Type G Code 13   Authorization Time Period 20   PT Start Time 1646   PT Stop Time 1730   PT Time Calculation (min) 44 min   Equipment Utilized During Treatment Gait belt   Activity Tolerance Patient tolerated treatment well   Behavior During Therapy WFL for tasks assessed/performed      Past Medical History:  Diagnosis Date  . Anxiety   . Depression   . Stroke [facility])    Doesn't remember year of stroke other than it not being recent    Past Surgical History:  Procedure Laterality Date  . TUBAL LIGATION      There were no vitals filed for this visit.      Subjective Assessment - 03/10/16 1705    Subjective Pt reports that she has felt good today and that the swelling in her feet has gone down from over the weekend.     Pertinent History Pertinent Factors that Affect Rehab Potential: limited transportation, previous stroke, falls, speech difficulties    Limitations Walking;Standing;Sitting   How long can you sit comfortably? N/A   How long can you stand comfortably? Few minutes, which is limited because of weakness   How long can you walk comfortably? 200 feet   Patient Stated Goals walk better,    Currently in Pain? No/denies   Pain Onset Yesterday      Treatment Nustep x 4 mins, BUEs/BLEs level 2 (unbilled) Leg press 2 sets x 10 reps, #90, min VCs for eccentric control   Calf raises, 2 sets x 10 reps, #40, min VCS to keep legs extended throughout  Sidestepping and squatting to pick up dots on the floor, no AD, CGA  for safety, min VCs for squat safety (5 mins)  Seated oblique twists with yellow weighted ball, 2 sets x 10 reps, min VCs for increase trunk rotation  Seated hip flexion marches, 2 sets x 10 reps on therapy ball, 1 HHA and min A to stabilize ball, min VCs for upright posture  Seated LAQ on therapy ball, 2 sets x 10 reps, 1 HHA and min A to stabilize ball, min VCs for eccentric control  Step taps on bosu ball, 2 sets x 10 reps, no HHA, min Vcs for increased glut activation for stability  Overhead press with 1.5# standing on blue airex pad with feet together, 2 sets x 10 reps, CGA for stability  Ball toss standing on blue airex pad with feed together, 2 sets x 5 reps, min A for stability,                             PT Education - 03/11/16 0800    Education provided Yes   Education Details reeducated on HEP, importance of using her cane in the house    Person(s) Educated Patient   Methods Explanation;Demonstration;Verbal cues   Comprehension Verbalized understanding;Returned demonstration;Verbal cues required  PT Long Term Goals - 02/27/16 1034      PT LONG TERM GOAL #1   Title Patient will demonstrate the ability to perform HEP independently in order to maintain strength and endurance gains in physical therapy to improve quality of life.   Time 4   Period Weeks   Status On-going     PT LONG TERM GOAL #2   Title Patient will decrease 5 times sit to stand <14 seconds in order to decrease her risk for falls and move safely in her home.   Time 4   Period Weeks   Status Partially Met     PT LONG TERM GOAL #3   Title Patient will increase her 10 meter walk time to 1.0 in order to decrease her risk for falls and perform ADLs safely.    Time 4   Period Weeks   Status Partially Met     PT LONG TERM GOAL #4   Title Patient will increase her overall strength to 4+/5 in UE and LE to allow the patient to perform gait and transfers more easily with  decreased fall risk.   Time 4   Period Weeks   Status Partially Met     PT LONG TERM GOAL #5   Title Patient will improve Berg Balance score to >42 to increase patients mobility and decrease her risk for falls in the home with ADLs.    Time 6   Period Weeks   Status Achieved               Plan - 03/11/16 0801    Clinical Impression Statement Pt tolerated exercises well today with decreased rest breaks and decreased fatigue.  She was able to perform dynamic balance exercises while dual tasking using UEs with CGA for stability.  Initated core strengthening with russian twists seated and hip marches/LAQ on therapy ball.  Pt continues to progress lower extremity strengthening with leg press and weighted calf raises.  She would continue to benefit from skilled physical therapy to increase her LE strength and dynamic balance for safer and more functional independence.     Rehab Potential Fair   Clinical Impairments Affecting Rehab Potential Positive factors: motivated,  Negative factors: multiple falls, previous stroke, speech difficulties Clinical presentation: evolving due to being a high fall risk and has low physical functioning;    PT Frequency 2x / week   PT Duration 4 weeks   PT Treatment/Interventions ADLs/Self Care Home Management;Aquatic Therapy;Electrical Stimulation;Moist Heat;DME Instruction;Gait training;Stair training;Functional mobility training;Therapeutic activities;Therapeutic exercise;Balance training;Neuromuscular re-education;Patient/family education;Manual techniques;Energy conservation;Cryotherapy;Orthotic Fit/Training   PT Next Visit Plan Address balance, strengthening, coordination, walking bouts to increase activity tolerance, continue HEP progression   PT Home Exercise Plan continue HEP as given   Consulted and Agree with Plan of Care Patient;Family member/caregiver      Patient will benefit from skilled therapeutic intervention in order to improve the following  deficits and impairments:  Decreased activity tolerance, Decreased balance, Decreased coordination, Decreased endurance, Decreased mobility, Decreased safety awareness, Decreased strength, Impaired UE functional use, Pain, Postural dysfunction, Improper body mechanics, Abnormal gait  Visit Diagnosis: Unsteadiness on feet  Muscle weakness (generalized)  Unspecified lack of coordination     Problem List Patient Active Problem List   Diagnosis Date Noted  . SINUSITIS- ACUTE-NOS 07/24/2008  . ALLERGIC RHINITIS 07/24/2008  . ACTINIC KERATOSIS 02/23/2008  . GASTROENTERITIS 09/08/2007  . CONTUSION, LOWER LEG 03/08/2007  . LUMBOSACRAL STRAIN 03/01/2007  . DEPRESSION 2020/09/1206  . HYPERTENSION 2020/09/1206  .  DIVERTICULOSIS, COLON 13-Jun-202008  . CONSTIPATION 13-Jun-202008  . SEBORRHEA 13-Jun-202008  . OSTEOARTHRITIS 13-Jun-202008  . OSTEOPOROSIS 13-Jun-202008   Stacy Gardner, SPT  This entire session was performed under direct supervision and direction of a licensed therapist/therapist assistant . I have personally read, edited and approve of the note as written.  Trotter,Margaret PT, DPT 03/11/2016, 8:32 AM  [place] MAIN [facility] SERVICES [address] [place], [place], [address] Phone: [phone]   Fax:  [phone]  Name: Latoya Morris MRN: [id number] Date of Birth: 04-12-1934

## 2016-03-16 ENCOUNTER — Ambulatory Visit: Payer: Medicare Other | Admitting: Physical Therapy

## 2016-03-16 ENCOUNTER — Encounter: Payer: Self-pay | Admitting: Physical Therapy

## 2016-03-16 DIAGNOSIS — R2681 Unsteadiness on feet: Secondary | ICD-10-CM

## 2016-03-16 DIAGNOSIS — R279 Unspecified lack of coordination: Secondary | ICD-10-CM

## 2016-03-16 DIAGNOSIS — M6281 Muscle weakness (generalized): Secondary | ICD-10-CM

## 2016-03-16 NOTE — Therapy (Signed)
[place] MAIN [facility] SERVICES [address] [place], [place], [address] Phone: [phone]   Fax:  [phone]  Physical Therapy Treatment  Patient Details  Name: Latoya Morris MRN: [id number] Date of Birth: Apr 26, 1934 Referring Provider: Dr. Melrose Nakayama  Encounter Date: 03/16/2016      PT End of Session - 03/16/16 1744    Visit Number 14   Number of Visits 21   Date for PT Re-Evaluation 03/26/16   Authorization Type G Code 14   Authorization Time Period 20   PT Start Time 7517   PT Stop Time 1820   PT Time Calculation (min) 40 min   Equipment Utilized During Treatment Gait belt   Activity Tolerance Patient tolerated treatment well   Behavior During Therapy WFL for tasks assessed/performed      Past Medical History:  Diagnosis Date  . Anxiety   . Depression   . Stroke [facility])    Doesn't remember year of stroke other than it not being recent    Past Surgical History:  Procedure Laterality Date  . TUBAL LIGATION      There were no vitals filed for this visit.      Subjective Assessment - 03/16/16 1743    Subjective Pt reports that she has felt good today and that the swelling goes down if she puts her feet up.    Pertinent History Pertinent Factors that Affect Rehab Potential: limited transportation, previous stroke, falls, speech difficulties    Limitations Walking;Standing;Sitting   How long can you sit comfortably? N/A   How long can you stand comfortably? Few minutes, which is limited because of weakness   How long can you walk comfortably? 200 feet   Patient Stated Goals walk better,    Currently in Pain? No/denies   Pain Score 0-No pain   Pain Onset Yesterday     Therapeutic exercise:  standing hip abd / hip extension with YTB x 15 BLE x 2 sets side stepping left and right in parallel bars 10 feet x 3 standing on blue foam with cone reaching x 20 across midline step ups from floor to 6 inch stool x 20 bilateral Leg  press with 90 lbs x 20 x 2 sit to stand x 10 without hands x 3 sets   Min cueing needed to appropriately perform strengthening and balance tasks with leg, hand, and head position. Patient responds well to verbal and tactile cues to correct form and technique.  SBA for safety with activities.                             PT Education - 03/16/16 1744    Education provided Yes   Education Details safety with mobility using cane   Person(s) Educated Patient   Methods Explanation   Comprehension Verbalized understanding             PT Long Term Goals - 02/27/16 1034      PT LONG TERM GOAL #1   Title Patient will demonstrate the ability to perform HEP independently in order to maintain strength and endurance gains in physical therapy to improve quality of life.   Time 4   Period Weeks   Status On-going     PT LONG TERM GOAL #2   Title Patient will decrease 5 times sit to stand <14 seconds in order to decrease her risk for falls and move safely in her home.  Time 4   Period Weeks   Status Partially Met     PT LONG TERM GOAL #3   Title Patient will increase her 10 meter walk time to 1.0 in order to decrease her risk for falls and perform ADLs safely.    Time 4   Period Weeks   Status Partially Met     PT LONG TERM GOAL #4   Title Patient will increase her overall strength to 4+/5 in UE and LE to allow the patient to perform gait and transfers more easily with decreased fall risk.   Time 4   Period Weeks   Status Partially Met     PT LONG TERM GOAL #5   Title Patient will improve Berg Balance score to >42 to increase patients mobility and decrease her risk for falls in the home with ADLs.    Time 6   Period Weeks   Status Achieved               Plan - 03/16/16 1745    Clinical Impression Statement Patient presents with decreased dynamic standing balance and LE weakness. She is ambulating with hurricane cane and reports fatigue with exericses  and balance challenges in standing. She is able to perform exericses wihtout pain behaviors.   Rehab Potential Fair   Clinical Impairments Affecting Rehab Potential Positive factors: motivated,  Negative factors: multiple falls, previous stroke, speech difficulties Clinical presentation: evolving due to being a high fall risk and has low physical functioning;    PT Frequency 2x / week   PT Duration 4 weeks   PT Treatment/Interventions ADLs/Self Care Home Management;Aquatic Therapy;Electrical Stimulation;Moist Heat;DME Instruction;Gait training;Stair training;Functional mobility training;Therapeutic activities;Therapeutic exercise;Balance training;Neuromuscular re-education;Patient/family education;Manual techniques;Energy conservation;Cryotherapy;Orthotic Fit/Training   PT Next Visit Plan Address balance, strengthening, coordination, walking bouts to increase activity tolerance, continue HEP progression   PT Home Exercise Plan continue HEP as given   Consulted and Agree with Plan of Care Patient;Family member/caregiver      Patient will benefit from skilled therapeutic intervention in order to improve the following deficits and impairments:  Decreased activity tolerance, Decreased balance, Decreased coordination, Decreased endurance, Decreased mobility, Decreased safety awareness, Decreased strength, Impaired UE functional use, Pain, Postural dysfunction, Improper body mechanics, Abnormal gait  Visit Diagnosis: Unsteadiness on feet  Muscle weakness (generalized)  Unspecified lack of coordination     Problem List Patient Active Problem List   Diagnosis Date Noted  . SINUSITIS- ACUTE-NOS 07/24/2008  . ALLERGIC RHINITIS 07/24/2008  . ACTINIC KERATOSIS 02/23/2008  . GASTROENTERITIS 09/08/2007  . CONTUSION, LOWER LEG 03/08/2007  . LUMBOSACRAL STRAIN 03/01/2007  . DEPRESSION 11/22/2006  . HYPERTENSION 11/22/2006  . DIVERTICULOSIS, COLON 11/22/2006  . CONSTIPATION 11/22/2006  .  SEBORRHEA 11/22/2006  . OSTEOARTHRITIS 11/22/2006  . OSTEOPOROSIS 11/22/2006   Kristine S Mansfield, PT, DPT Mansfield, Kristine S 03/16/2016, 5:48 PM  [place] [facility] REH[facility] SERVICES [address] [place], [place], [address] Phone: [phone]   Fax:  [phone]  Name: Latoya Morris MRN: [id number] Date of Birth: 11/28/1933    

## 2016-03-17 ENCOUNTER — Encounter: Payer: Self-pay | Admitting: Physical Therapy

## 2016-03-17 ENCOUNTER — Ambulatory Visit: Payer: Medicare Other | Admitting: Physical Therapy

## 2016-03-17 DIAGNOSIS — M6281 Muscle weakness (generalized): Secondary | ICD-10-CM

## 2016-03-17 DIAGNOSIS — R2681 Unsteadiness on feet: Secondary | ICD-10-CM

## 2016-03-17 DIAGNOSIS — R279 Unspecified lack of coordination: Secondary | ICD-10-CM

## 2016-03-18 NOTE — Therapy (Signed)
[place] MAIN [facility] SERVICES [address] [place], [place], [address] Phone: [phone]   Fax:  [phone]  Physical Therapy Treatment  Patient Details  Name: Latoya Morris MRN: [id number] Date of Birth: January 31, 1934 Referring Provider: Dr. Melrose Nakayama  Encounter Date: 03/17/2016      PT End of Session - 03/18/16 0906    Visit Number 15   Number of Visits 21   Date for PT Re-Evaluation 03/26/16   Authorization Type G Code 15   Authorization Time Period 20   PT Start Time 9191   PT Stop Time 1728   PT Time Calculation (min) 43 min   Equipment Utilized During Treatment Gait belt   Activity Tolerance Patient tolerated treatment well;No increased pain   Behavior During Therapy WFL for tasks assessed/performed      Past Medical History:  Diagnosis Date  . Anxiety   . Depression   . Stroke [facility])    Doesn't remember year of stroke other than it not being recent    Past Surgical History:  Procedure Laterality Date  . TUBAL LIGATION      There were no vitals filed for this visit.      Subjective Assessment - 03/17/16 1647    Subjective Patient reports she is doing well; denies falls. She reports she is able to do HEP still and has been doing the bike at home some.    Pertinent History Pertinent Factors that Affect Rehab Potential: limited transportation, previous stroke, falls, speech difficulties    Limitations Walking;Standing;Sitting   How long can you sit comfortably? N/A   How long can you stand comfortably? Few minutes, which is limited because of weakness   How long can you walk comfortably? 200 feet   Patient Stated Goals walk better,    Currently in Pain? No/denies   Pain Onset Yesterday      Treatment:  NuStep, L3, BUE/LE, x3 minutes (1 min of history, 2 min. unbilled)  Lateral side steps over wooden beam, x4, min assist to maintain balance, min VCs to perform correctly  Agility ladder,  x2 each activity, min-modA  to maintain balance with SPT helping patient regain balance Forward/backwards stepping into and out of ladder  Forward walking one foot in each ladder opening  Lateral eccentric lowering on 4" step, 2x5 each side, min VCs to increase eccentric lowering  Pball core work. SPT provided CGA from behind during activities, minA to stabilize pball, min VCs to maintain posture during activities Sit to stands, x8, min VCs to control eccentrically Marches, 2x10 each LE,  LAQ, 2x10 each LE  Cone reaches and placement with short ambulation, placement performed with RUE for improved coordination. 2x3 cones, min VCs on placement and CGA during reaching outside of base of support  Instructed to ambulate with SPC, min VCs to perform heel to toe on LLE not just R. ~100 feet,  Instructed on walking with dual motor task, instructed to hold full glass of water while ambulating no AD, CGA for safety, water held with LUE due to shaking with RUE    Tilt board: CGA-minA to maintain stability, no HHA Lateral tapping, x10, Anterior/posterior tapping, 2x10, and statically holding in ant/post (2x30 seconds) and lateral (x30 seconds)  Tandem stance on half bolster with flat side down, 2x30 seconds each LE leading, minA for stability, no HHA  PT Education - 03/18/16 0904    Education provided Yes   Education Details safety, exercising, footwear   Person(s) Educated Patient   Methods Explanation;Verbal cues   Comprehension Verbalized understanding;Verbal cues required             PT Long Term Goals - 02/27/16 1034      PT LONG TERM GOAL #1   Title Patient will demonstrate the ability to perform HEP independently in order to maintain strength and endurance gains in physical therapy to improve quality of life.   Time 4   Period Weeks   Status On-going     PT LONG TERM GOAL #2   Title Patient will decrease 5 times sit to stand <14 seconds in order to  decrease her risk for falls and move safely in her home.   Time 4   Period Weeks   Status Partially Met     PT LONG TERM GOAL #3   Title Patient will increase her 10 meter walk time to 1.0 in order to decrease her risk for falls and perform ADLs safely.    Time 4   Period Weeks   Status Partially Met     PT LONG TERM GOAL #4   Title Patient will increase her overall strength to 4+/5 in UE and LE to allow the patient to perform gait and transfers more easily with decreased fall risk.   Time 4   Period Weeks   Status Partially Met     PT LONG TERM GOAL #5   Title Patient will improve Berg Balance score to >42 to increase patients mobility and decrease her risk for falls in the home with ADLs.    Time 6   Period Weeks   Status Achieved               Plan - 03/18/16 0907    Clinical Impression Statement Patient has decreased dynamic balance requiring min-modA for stability during agility ladder and lateral stepping over wooden board. Patient continues to breathe heavy and have fatigue during exercises and requires increased breaks. Patient reports soreness with LLE lateral step downs with no pain.  Patient would continue to benefit from skilled PT in order to address pain, strengthening, and dynamic balance.   Rehab Potential Fair   Clinical Impairments Affecting Rehab Potential Positive factors: motivated,  Negative factors: multiple falls, previous stroke, speech difficulties Clinical presentation: evolving due to being a high fall risk and has low physical functioning;    PT Frequency 2x / week   PT Duration 4 weeks   PT Treatment/Interventions ADLs/Self Care Home Management;Aquatic Therapy;Electrical Stimulation;Moist Heat;DME Instruction;Gait training;Stair training;Functional mobility training;Therapeutic activities;Therapeutic exercise;Balance training;Neuromuscular re-education;Patient/family education;Manual techniques;Energy conservation;Cryotherapy;Orthotic Fit/Training    PT Next Visit Plan Address balance, strengthening, coordination, walking bouts to increase activity tolerance, continue HEP progression   PT Home Exercise Plan continue HEP as given   Consulted and Agree with Plan of Care Patient;Family member/caregiver      Patient will benefit from skilled therapeutic intervention in order to improve the following deficits and impairments:  Decreased activity tolerance, Decreased balance, Decreased coordination, Decreased endurance, Decreased mobility, Decreased safety awareness, Decreased strength, Impaired UE functional use, Pain, Postural dysfunction, Improper body mechanics, Abnormal gait  Visit Diagnosis: Unsteadiness on feet  Muscle weakness (generalized)  Unspecified lack of coordination     Problem List Patient Active Problem List   Diagnosis Date Noted  . SINUSITIS- ACUTE-NOS 07/24/2008  . ALLERGIC RHINITIS 07/24/2008  . ACTINIC KERATOSIS 02/23/2008  .  GASTROENTERITIS 09/08/2007  . CONTUSION, LOWER LEG 03/08/2007  . LUMBOSACRAL STRAIN 03/01/2007  . DEPRESSION 2020-06-407  . HYPERTENSION 2020-06-407  . DIVERTICULOSIS, COLON 2020-06-407  . CONSTIPATION 2020-06-407  . SEBORRHEA 2020-06-407  . OSTEOARTHRITIS 2020-06-407  . OSTEOPOROSIS 2020-06-407   Tilman Neat, SPT This entire session was performed under direct supervision and direction of a licensed therapist/therapist assistant . I have personally read, edited and approve of the note as written.  Trotter,Margaret PT, DPT 03/18/2016, 10:00 AM  [place] MAIN [facility] SERVICES [address] [place], [place], [address] Phone: [phone]   Fax:  [phone]  Name: Latoya Morris MRN: [id number] Date of Birth: 03/12/1934

## 2016-03-19 ENCOUNTER — Ambulatory Visit: Payer: Medicare Other | Admitting: Physical Therapy

## 2016-03-22 ENCOUNTER — Ambulatory Visit: Payer: Medicare Other

## 2016-03-22 DIAGNOSIS — M6281 Muscle weakness (generalized): Secondary | ICD-10-CM

## 2016-03-22 DIAGNOSIS — R2681 Unsteadiness on feet: Secondary | ICD-10-CM | POA: Diagnosis not present

## 2016-03-22 DIAGNOSIS — R279 Unspecified lack of coordination: Secondary | ICD-10-CM

## 2016-03-22 NOTE — Therapy (Signed)
[place] MAIN [facility] SERVICES [address] [place], [place], [address] Phone: [phone]   Fax:  [phone]  Physical Therapy Treatment  Patient Details  Name: Latoya Morris MRN: [id number] Date of Birth: 08-05-1934 Referring Provider: Dr. Melrose Nakayama  Encounter Date: 03/22/2016      PT End of Session - 03/22/16 1709    Visit Number 16   Number of Visits 21   Date for PT Re-Evaluation 03/26/16   Authorization Type G Code 16   Authorization Time Period 20   PT Start Time 2536   PT Stop Time 1730   PT Time Calculation (min) 45 min   Equipment Utilized During Treatment Gait belt   Activity Tolerance Patient tolerated treatment well;No increased pain   Behavior During Therapy WFL for tasks assessed/performed      Past Medical History:  Diagnosis Date  . Anxiety   . Depression   . Stroke [facility])    Doesn't remember year of stroke other than it not being recent    Past Surgical History:  Procedure Laterality Date  . TUBAL LIGATION      There were no vitals filed for this visit.      Subjective Assessment - 03/22/16 1708    Subjective Pt reported no falls over the weekend and states she feels pretty good today.   Pertinent History Pertinent Factors that Affect Rehab Potential: limited transportation, previous stroke, falls, speech difficulties    Limitations Walking;Standing;Sitting   How long can you sit comfortably? N/A   How long can you stand comfortably? Few minutes, which is limited because of weakness   How long can you walk comfortably? 200 feet   Patient Stated Goals walk better,    Currently in Pain? No/denies   Pain Onset Yesterday     NMR: TM w/ x 3 mins at 1.41mh, min verbal cues for increased step length bil, proper breathing technique  Bil staggered stance on rocker board, maintaining in middle, with UE lifts, OH elevation, and trunk rotations with 2.5# weight, 1x10 each direction; increased difficulty and  posterior weight shift with RLE back, CGA - min A throughout Bil side stepping on long airex x 5 laps each with no UE support; CGA - min A for balance Fwd/retro tandem on firm surface in // bars x 5 laps with no UE support; increased unsteadiness, decreased step length and, increased posterior weight shift with retro; CGA - min A during retro Cone taps while on airex 2x5 each with no UE support; increased difficulty with tapping LLE, min A throughout for steadiness, increased posterior weight shift March with 3 sec SLS on 3rd step, 1x5 each with no UE support; min verbal cues for proper technique, CGA for balance, increased difficulty balance on RLE Star drill on firm surface (tapping all points of star) 1x5 each with no UE support; CGA - min A for balance      PT Education - 03/22/16 1709    Education provided Yes   Education Details proper weight shifting, proper breathing to decrease SOB   Person(s) Educated Patient   Methods Explanation   Comprehension Verbalized understanding             PT Long Term Goals - 02/27/16 1034      PT LONG TERM GOAL #1   Title Patient will demonstrate the ability to perform HEP independently in order to maintain strength and endurance gains in physical therapy to improve quality of life.   Time  4   Period Weeks   Status On-going     PT LONG TERM GOAL #2   Title Patient will decrease 5 times sit to stand <14 seconds in order to decrease her risk for falls and move safely in her home.   Time 4   Period Weeks   Status Partially Met     PT LONG TERM GOAL #3   Title Patient will increase her 10 meter walk time to 1.0 in order to decrease her risk for falls and perform ADLs safely.    Time 4   Period Weeks   Status Partially Met     PT LONG TERM GOAL #4   Title Patient will increase her overall strength to 4+/5 in UE and LE to allow the patient to perform gait and transfers more easily with decreased fall risk.   Time 4   Period Weeks    Status Partially Met     PT LONG TERM GOAL #5   Title Patient will improve Berg Balance score to >42 to increase patients mobility and decrease her risk for falls in the home with ADLs.    Time 6   Period Weeks   Status Achieved               Plan - 03/22/16 1732    Clinical Impression Statement Pt tolerated progressed dynamic balance activites this date but continues to demonstrate posterior and lateral LOB throughout, requiring UE support ot maintain balance. Pt continues to demonstrate fatigue and requires frequent rest breaks during session. Pt needs continued skilled PT intervention to maximize overall function and decrease risk of falls.   Rehab Potential Fair   Clinical Impairments Affecting Rehab Potential Positive factors: motivated,  Negative factors: multiple falls, previous stroke, speech difficulties Clinical presentation: evolving due to being a high fall risk and has low physical functioning;    PT Frequency 2x / week   PT Duration 4 weeks   PT Treatment/Interventions ADLs/Self Care Home Management;Aquatic Therapy;Electrical Stimulation;Moist Heat;DME Instruction;Gait training;Stair training;Functional mobility training;Therapeutic activities;Therapeutic exercise;Balance training;Neuromuscular re-education;Patient/family education;Manual techniques;Energy conservation;Cryotherapy;Orthotic Fit/Training   PT Next Visit Plan Address balance, strengthening, coordination, walking bouts to increase activity tolerance, continue HEP progression   PT Home Exercise Plan continue HEP as given   Consulted and Agree with Plan of Care Patient;Family member/caregiver      Patient will benefit from skilled therapeutic intervention in order to improve the following deficits and impairments:  Decreased activity tolerance, Decreased balance, Decreased coordination, Decreased endurance, Decreased mobility, Decreased safety awareness, Decreased strength, Impaired UE functional use, Pain,  Postural dysfunction, Improper body mechanics, Abnormal gait  Visit Diagnosis: Unsteadiness on feet  Muscle weakness (generalized)  Unspecified lack of coordination     Problem List Patient Active Problem List   Diagnosis Date Noted  . SINUSITIS- ACUTE-NOS 07/24/2008  . ALLERGIC RHINITIS 07/24/2008  . ACTINIC KERATOSIS 02/23/2008  . GASTROENTERITIS 09/08/2007  . CONTUSION, LOWER LEG 03/08/2007  . LUMBOSACRAL STRAIN 03/01/2007  . DEPRESSION 09-Jan-202008  . HYPERTENSION 09-Jan-202008  . DIVERTICULOSIS, COLON 09-Jan-202008  . CONSTIPATION 09-Jan-202008  . SEBORRHEA 09-Jan-202008  . OSTEOARTHRITIS 09-Jan-202008  . OSTEOPOROSIS 09-Jan-202008   Geraldine Solar, SPT This entire session was performed under direct supervision and direction of a licensed therapist/therapist assistant . I have personally read, edited and approve of the note as written. Gorden Harms. Tortorici, PT, DPT [phone]  Tortorici,Ashley 03/22/2016, 5:37 PM  [place] MAIN [facility] SERVICES [address] [place], [place], [address] Phone:  [phone]   Fax:  [phone]  Name: Latoya Morris MRN: [id number] Date of Birth: [birth year]

## 2016-03-24 ENCOUNTER — Ambulatory Visit: Payer: Medicare Other | Admitting: Physical Therapy

## 2016-03-26 ENCOUNTER — Ambulatory Visit: Payer: Medicare Other | Admitting: Physical Therapy

## 2016-03-29 ENCOUNTER — Ambulatory Visit: Payer: Medicare Other

## 2016-03-29 VITALS — BP 160/39 | HR 67

## 2016-03-29 DIAGNOSIS — R2681 Unsteadiness on feet: Secondary | ICD-10-CM | POA: Diagnosis not present

## 2016-03-29 DIAGNOSIS — M6281 Muscle weakness (generalized): Secondary | ICD-10-CM

## 2016-03-29 NOTE — Therapy (Signed)
[place] MAIN [facility] SERVICES [address] [place], [place], [address] Phone: [phone]   Fax:  [phone]  Physical Therapy Treatment/Discharge  Patient Details  Name: Latoya Morris MRN: [id number] Date of Birth: 1934-05-16 Referring Provider: Dr. Melrose Nakayama  Encounter Date: 03/29/2016      PT End of Session - 03/29/16 0806    Visit Number 17   Number of Visits 21   Date for PT Re-Evaluation 03/26/16   Authorization Type G Code 17   Authorization Time Period 21   PT Start Time 0809   PT Stop Time 0847   PT Time Calculation (min) 38 min   Equipment Utilized During Treatment Gait belt   Activity Tolerance Patient tolerated treatment well   Behavior During Therapy WFL for tasks assessed/performed      Past Medical History:  Diagnosis Date  . Anxiety   . Depression   . Stroke [facility])    Doesn't remember year of stroke other than it not being recent    Past Surgical History:  Procedure Laterality Date  . TUBAL LIGATION      Vitals:   03/29/16 0811  BP: (!) 160/39  Pulse: 67  SpO2: 99%        Subjective Assessment - 03/29/16 0805    Subjective Pt states she is doing well on this date. She believes that she is not making any further progress and would like to make today her last visit. No pain and no specific questions or concerns currently.    Pertinent History Pertinent Factors that Affect Rehab Potential: limited transportation, previous stroke, falls, speech difficulties    Limitations Walking;Standing;Sitting   How long can you sit comfortably? N/A   How long can you stand comfortably? Few minutes, which is limited because of weakness   How long can you walk comfortably? 200 feet   Patient Stated Goals walk better,    Currently in Pain? No/denies            [facility] PT Assessment - 03/29/16 0816      Standardized Balance Assessment   Standardized Balance Assessment Berg Balance Test;Five Times Sit to Stand;10  meter walk test   Five times sit to stand comments  14.5 seconds   10 Meter Walk 11.75 seconds, 0.85 m/s     Berg Balance Test   Sit to Stand Able to stand without using hands and stabilize independently   Standing Unsupported Able to stand safely 2 minutes   Sitting with Back Unsupported but Feet Supported on Floor or Stool Able to sit safely and securely 2 minutes   Stand to Sit Sits safely with minimal use of hands   Transfers Able to transfer safely, minor use of hands   Standing Unsupported with Eyes Closed Able to stand 10 seconds safely   Standing Ubsupported with Feet Together Able to place feet together independently and stand 1 minute safely   From Standing, Reach Forward with Outstretched Arm Can reach forward >12 cm safely (5")   From Standing Position, Pick up Object from Floor Able to pick up shoe safely and easily   From Standing Position, Turn to Look Behind Over each Shoulder Looks behind from both sides and weight shifts well   Turn 360 Degrees Able to turn 360 degrees safely in 4 seconds or less   Standing Unsupported, Alternately Place Feet on Step/Stool Able to stand independently and safely and complete 8 steps in 20 seconds   Standing Unsupported, One Foot  in [place] to plae foot ahead of the other independently and hold 30 seconds   Standing on One Leg Tries to lift leg/unable to hold 3 seconds but remains standing independently   Total Score 51       TREATMENT  Physical Performance Performed 5TSTS, BERG, and 32mgait with patient; Updated goals and discussed HEP with patient; Performed MMT to assess strength;   MMT: LUE/LLE: grossly 4+/5 throughout with the exception of 4-/5 hip flexion, RUE: 4+/5 with the exception of R shoulder flexion/abduction and R elbow extension grossly 4-/5 to 4/5. RLE: grossly 4+/5 throughout with the exception of 4-/5 R hip flexion;  Neuromuscular Re-education Rockerboard balance A/P and R/L with static balance and weight  shifting; Step-ups to 6" step alternating LE x 10 each side;  Pt requires CGA/minA+1 as well as verbal cues for balance activities on rockerboard. Fatigue noted during step-ups.                        PT Education - 03/29/16 0805    Education provided Yes   Education Details HEP review and plan of care discussion. Goals updated with patient.    Person(s) Educated Patient   Methods Explanation   Comprehension Verbalized understanding             PT Long Term Goals - 03/29/16 0807      PT LONG TERM GOAL #1   Title Patient will demonstrate the ability to perform HEP independently in order to maintain strength and endurance gains in physical therapy to improve quality of life.   Time 4   Period Weeks   Status Achieved     PT LONG TERM GOAL #2   Title Patient will decrease 5 times sit to stand <14 seconds in order to decrease her risk for falls and move safely in her home.   Baseline 03/29/16: 14.50 seconds   Time 4   Period Weeks   Status Partially Met     PT LONG TERM GOAL #3   Title Patient will increase her 10 meter walk time to 1.0 in order to decrease her risk for falls and perform ADLs safely.    Baseline 03/29/16: 0.85 m/s   Time 4   Period Weeks   Status Partially Met     PT LONG TERM GOAL #4   Title Patient will increase her overall strength to 4+/5 in UE and LE to allow the patient to perform gait and transfers more easily with decreased fall risk.   Baseline 03/29/16: Still lacks R shoulder flexion/abduction and R hip flexion strength (4- to 4/5)   Time 4   Period Weeks   Status On-going     PT LONG TERM GOAL #5   Title Patient will improve Berg Balance score to >42 to increase patients mobility and decrease her risk for falls in the home with ADLs.    Baseline 03/29/16: 51/56   Time 6   Period Weeks   Status Achieved               Plan - 03/29/16 04270   Clinical Impression Statement Pt has demonstrated excellent progress with  physical therapy. She improved her BERG from 40/56 at initial evaluation to 51/56 on this date. Gait speed increased but is still below necessary speed for full community ambulation. Her 5TSTS decreased significantly and is currently 14.5 seconds which is just slightly higher than the cut-off for increased fall risk. Pt would  like to make today her last therapy session. She believes that she is not making any further progress. Pt will be discharged at this time due to patient preference. Please send new order if status changes or patients needs further PT services.    Rehab Potential Fair   Clinical Impairments Affecting Rehab Potential Positive factors: motivated,  Negative factors: multiple falls, previous stroke, speech difficulties Clinical presentation: evolving due to being a high fall risk and has low physical functioning;    PT Frequency 2x / week   PT Duration 4 weeks   PT Treatment/Interventions ADLs/Self Care Home Management;Aquatic Therapy;Electrical Stimulation;Moist Heat;DME Instruction;Gait training;Stair training;Functional mobility training;Therapeutic activities;Therapeutic exercise;Balance training;Neuromuscular re-education;Patient/family education;Manual techniques;Energy conservation;Cryotherapy;Orthotic Fit/Training   PT Next Visit Plan Address balance, strengthening, coordination, walking bouts to increase activity tolerance, continue HEP progression   PT Home Exercise Plan continue HEP as given   Consulted and Agree with Plan of Care Patient;Family member/caregiver      Patient will benefit from skilled therapeutic intervention in order to improve the following deficits and impairments:  Decreased activity tolerance, Decreased balance, Decreased coordination, Decreased endurance, Decreased mobility, Decreased safety awareness, Decreased strength, Impaired UE functional use, Pain, Postural dysfunction, Improper body mechanics, Abnormal gait  Visit Diagnosis: Unsteadiness on  feet  Muscle weakness (generalized)       G-Codes - 04/23/16 0955    Functional Assessment Tool Used 10 meter walk, 5 times sit<>Stand, clinical judgement, BERG   Functional Limitation Mobility: Walking and moving around   Mobility: Walking and Moving Around Goal Status [phone]) At least 20 percent but less than 40 percent impaired, limited or restricted   Mobility: Walking and Moving Around Discharge Status [phone]) At least 20 percent but less than 40 percent impaired, limited or restricted      Problem List Patient Active Problem List   Diagnosis Date Noted  . SINUSITIS- ACUTE-NOS 07/24/2008  . ALLERGIC RHINITIS 07/24/2008  . ACTINIC KERATOSIS 02/23/2008  . GASTROENTERITIS 09/08/2007  . CONTUSION, LOWER LEG 03/08/2007  . LUMBOSACRAL STRAIN 03/01/2007  . DEPRESSION Feb 13, 202008  . HYPERTENSION Feb 13, 202008  . DIVERTICULOSIS, COLON Feb 13, 202008  . CONSTIPATION Feb 13, 202008  . SEBORRHEA Feb 13, 202008  . OSTEOARTHRITIS Feb 13, 202008  . OSTEOPOROSIS Feb 13, 202008   Phillips Grout PT, DPT   Cathrine Krizan 2016/04/23, 10:02 AM  [place] MAIN [facility] SERVICES [address] [place], [place], [address] Phone: [phone]   Fax:  [phone]  Name: ASHERAH LAVOY MRN: [id number] Date of Birth: [birth year]

## 2016-04-05 ENCOUNTER — Ambulatory Visit: Payer: Medicare Other | Admitting: Physical Therapy

## 2016-04-07 ENCOUNTER — Ambulatory Visit: Payer: Medicare Other | Admitting: Physical Therapy

## 2017-05-18 ENCOUNTER — Inpatient Hospital Stay (HOSPITAL_COMMUNITY)
Admission: EM | Admit: 2017-05-18 | Discharge: 2017-05-20 | DRG: 690 | Disposition: A | Discharge status: Home Health | Payer: Medicare Other | Attending: Family Medicine | Admitting: Family Medicine

## 2017-05-18 ENCOUNTER — Emergency Department (HOSPITAL_COMMUNITY): Payer: Medicare Other

## 2017-05-18 ENCOUNTER — Encounter (HOSPITAL_COMMUNITY): Payer: Self-pay | Admitting: *Deleted

## 2017-05-18 DIAGNOSIS — W06XXXA Fall from bed, initial encounter: Secondary | ICD-10-CM | POA: Diagnosis present

## 2017-05-18 DIAGNOSIS — Y92009 Unspecified place in unspecified non-institutional (private) residence as the place of occurrence of the external cause: Secondary | ICD-10-CM | POA: Diagnosis not present

## 2017-05-18 DIAGNOSIS — L299 Pruritus, unspecified: Secondary | ICD-10-CM | POA: Diagnosis present

## 2017-05-18 DIAGNOSIS — R296 Repeated falls: Secondary | ICD-10-CM | POA: Diagnosis present

## 2017-05-18 DIAGNOSIS — I10 Essential (primary) hypertension: Secondary | ICD-10-CM | POA: Diagnosis present

## 2017-05-18 DIAGNOSIS — Z87891 Personal history of nicotine dependence: Secondary | ICD-10-CM | POA: Diagnosis not present

## 2017-05-18 DIAGNOSIS — E860 Dehydration: Secondary | ICD-10-CM | POA: Diagnosis present

## 2017-05-18 DIAGNOSIS — R4182 Altered mental status, unspecified: Secondary | ICD-10-CM | POA: Diagnosis not present

## 2017-05-18 DIAGNOSIS — R319 Hematuria, unspecified: Secondary | ICD-10-CM

## 2017-05-18 DIAGNOSIS — K5900 Constipation, unspecified: Secondary | ICD-10-CM | POA: Diagnosis present

## 2017-05-18 DIAGNOSIS — Z7902 Long term (current) use of antithrombotics/antiplatelets: Secondary | ICD-10-CM | POA: Diagnosis not present

## 2017-05-18 DIAGNOSIS — F419 Anxiety disorder, unspecified: Secondary | ICD-10-CM | POA: Diagnosis present

## 2017-05-18 DIAGNOSIS — Z8673 Personal history of transient ischemic attack (TIA), and cerebral infarction without residual deficits: Secondary | ICD-10-CM

## 2017-05-18 DIAGNOSIS — Z79899 Other long term (current) drug therapy: Secondary | ICD-10-CM | POA: Diagnosis not present

## 2017-05-18 DIAGNOSIS — N390 Urinary tract infection, site not specified: Secondary | ICD-10-CM | POA: Diagnosis not present

## 2017-05-18 DIAGNOSIS — F32A Depression, unspecified: Secondary | ICD-10-CM | POA: Diagnosis present

## 2017-05-18 DIAGNOSIS — R739 Hyperglycemia, unspecified: Secondary | ICD-10-CM | POA: Diagnosis present

## 2017-05-18 DIAGNOSIS — Z880 Allergy status to penicillin: Secondary | ICD-10-CM

## 2017-05-18 DIAGNOSIS — F329 Major depressive disorder, single episode, unspecified: Secondary | ICD-10-CM | POA: Diagnosis present

## 2017-05-18 DIAGNOSIS — D649 Anemia, unspecified: Secondary | ICD-10-CM | POA: Diagnosis present

## 2017-05-18 DIAGNOSIS — Z23 Encounter for immunization: Secondary | ICD-10-CM | POA: Diagnosis not present

## 2017-05-18 DIAGNOSIS — Z9181 History of falling: Secondary | ICD-10-CM | POA: Diagnosis not present

## 2017-05-18 DIAGNOSIS — J208 Acute bronchitis due to other specified organisms: Secondary | ICD-10-CM | POA: Diagnosis present

## 2017-05-18 DIAGNOSIS — E785 Hyperlipidemia, unspecified: Secondary | ICD-10-CM | POA: Diagnosis present

## 2017-05-18 DIAGNOSIS — R509 Fever, unspecified: Secondary | ICD-10-CM | POA: Diagnosis present

## 2017-05-18 DIAGNOSIS — A419 Sepsis, unspecified organism: Secondary | ICD-10-CM

## 2017-05-18 DIAGNOSIS — R7989 Other specified abnormal findings of blood chemistry: Secondary | ICD-10-CM

## 2017-05-18 HISTORY — DX: Hyperlipidemia, unspecified: E78.5

## 2017-05-18 HISTORY — DX: Constipation, unspecified: K59.00

## 2017-05-18 HISTORY — DX: Essential (primary) hypertension: I10

## 2017-05-18 LAB — CBC WITH DIFFERENTIAL/PLATELET
Basophils Absolute: 0 10*3/uL (ref 0.0–0.1)
Basophils Relative: 0 %
EOS PCT: 1 %
Eosinophils Absolute: 0.1 10*3/uL (ref 0.0–0.7)
HEMATOCRIT: 34.1 % — AB (ref 36.0–46.0)
Hemoglobin: 10.8 g/dL — ABNORMAL LOW (ref 12.0–15.0)
LYMPHS ABS: 0.5 10*3/uL — AB (ref 0.7–4.0)
LYMPHS PCT: 4 %
MCH: 27.1 pg (ref 26.0–34.0)
MCHC: 31.7 g/dL (ref 30.0–36.0)
MCV: 85.5 fL (ref 78.0–100.0)
MONO ABS: 1.1 10*3/uL — AB (ref 0.1–1.0)
MONOS PCT: 10 %
NEUTROS ABS: 9.9 10*3/uL — AB (ref 1.7–7.7)
Neutrophils Relative %: 85 %
PLATELETS: 170 10*3/uL (ref 150–400)
RBC: 3.99 MIL/uL (ref 3.87–5.11)
RDW: 14.7 % (ref 11.5–15.5)
WBC: 11.6 10*3/uL — ABNORMAL HIGH (ref 4.0–10.5)

## 2017-05-18 LAB — COMPREHENSIVE METABOLIC PANEL
ALT: 16 U/L (ref 14–54)
AST: 24 U/L (ref 15–41)
Albumin: 3.9 g/dL (ref 3.5–5.0)
Alkaline Phosphatase: 68 U/L (ref 38–126)
Anion gap: 11 (ref 5–15)
BILIRUBIN TOTAL: 0.7 mg/dL (ref 0.3–1.2)
BUN: 14 mg/dL (ref 6–20)
CHLORIDE: 103 mmol/L (ref 101–111)
CO2: 23 mmol/L (ref 22–32)
Calcium: 8.5 mg/dL — ABNORMAL LOW (ref 8.9–10.3)
Creatinine, Ser: 1.05 mg/dL — ABNORMAL HIGH (ref 0.44–1.00)
GFR, EST AFRICAN AMERICAN: 57 mL/min — AB (ref >60)
GFR, EST NON AFRICAN AMERICAN: 49 mL/min — AB (ref >60)
Glucose, Bld: 188 mg/dL — ABNORMAL HIGH (ref 65–99)
POTASSIUM: 3.5 mmol/L (ref 3.5–5.1)
Sodium: 137 mmol/L (ref 135–145)
TOTAL PROTEIN: 6.8 g/dL (ref 6.5–8.1)

## 2017-05-18 LAB — URINALYSIS, ROUTINE W REFLEX MICROSCOPIC
Bilirubin Urine: NEGATIVE
GLUCOSE, UA: NEGATIVE mg/dL
Ketones, ur: NEGATIVE mg/dL
NITRITE: NEGATIVE
PROTEIN: NEGATIVE mg/dL
SPECIFIC GRAVITY, URINE: 1.014 (ref 1.005–1.030)
pH: 5 (ref 5.0–8.0)

## 2017-05-18 LAB — GLUCOSE, CAPILLARY
GLUCOSE-CAPILLARY: 113 mg/dL — AB (ref 65–99)
GLUCOSE-CAPILLARY: 118 mg/dL — AB (ref 65–99)
Glucose-Capillary: 102 mg/dL — ABNORMAL HIGH (ref 65–99)
Glucose-Capillary: 101 mg/dL — ABNORMAL HIGH (ref 65–99)

## 2017-05-18 LAB — I-STAT CG4 LACTIC ACID, ED: Lactic Acid, Venous: 2.27 mmol/L (ref 0.5–1.9)

## 2017-05-18 LAB — MAGNESIUM: Magnesium: 1.7 mg/dL (ref 1.7–2.4)

## 2017-05-18 LAB — HEMOGLOBIN A1C
HEMOGLOBIN A1C: 5.6 % (ref 4.8–5.6)
Mean Plasma Glucose: 114.02 mg/dL

## 2017-05-18 MED ORDER — ONDANSETRON HCL 4 MG PO TABS: 4.0000 mg | ORAL_TABLET | Freq: Four times a day (QID) | ORAL | Status: DC | PRN

## 2017-05-18 MED ORDER — ONDANSETRON HCL 4 MG/2ML IJ SOLN: 4.0000 mg | Freq: Four times a day (QID) | INTRAMUSCULAR | Status: DC | PRN

## 2017-05-18 MED ORDER — DEXTROSE 5 % IV SOLN
1.0000 g | INTRAVENOUS | Status: DC
  Administered 2017-05-19 – 2017-05-20 (×2): 1 g via INTRAVENOUS
  Filled 2017-05-18 (×5): qty 10

## 2017-05-18 MED ORDER — AZELASTINE HCL 0.1 % NA SOLN
1.0000 | Freq: Two times a day (BID) | NASAL | Status: DC
  Administered 2017-05-18 – 2017-05-20 (×5): 1 via NASAL
  Filled 2017-05-18: qty 30

## 2017-05-18 MED ORDER — INFLUENZA VAC SPLIT HIGH-DOSE 0.5 ML IM SUSY
0.5000 mL | PREFILLED_SYRINGE | INTRAMUSCULAR | Status: AC
  Administered 2017-05-19: 0.5 mL via INTRAMUSCULAR
  Filled 2017-05-18: qty 0.5

## 2017-05-18 MED ORDER — BISACODYL 5 MG PO TBEC: 5.0000 mg | DELAYED_RELEASE_TABLET | Freq: Every day | ORAL | Status: DC | PRN

## 2017-05-18 MED ORDER — DEXTROSE 5 % IV SOLN
1.0000 g | Freq: Once | INTRAVENOUS | Status: AC
  Administered 2017-05-18: 1 g via INTRAVENOUS
  Filled 2017-05-18: qty 10

## 2017-05-18 MED ORDER — ALBUTEROL SULFATE (2.5 MG/3ML) 0.083% IN NEBU: 2.5000 mg | INHALATION_SOLUTION | Freq: Four times a day (QID) | RESPIRATORY_TRACT | Status: DC | PRN

## 2017-05-18 MED ORDER — TRAZODONE HCL 50 MG PO TABS
250.0000 mg | ORAL_TABLET | Freq: Every day | ORAL | Status: DC
  Administered 2017-05-18 – 2017-05-19 (×2): 250 mg via ORAL
  Filled 2017-05-18 (×2): qty 5

## 2017-05-18 MED ORDER — FLUOXETINE HCL 20 MG PO CAPS
20.0000 mg | ORAL_CAPSULE | Freq: Every day | ORAL | Status: DC
  Administered 2017-05-18 – 2017-05-20 (×3): 20 mg via ORAL
  Filled 2017-05-18 (×3): qty 1

## 2017-05-18 MED ORDER — MELOXICAM 7.5 MG PO TABS
7.5000 mg | ORAL_TABLET | Freq: Every day | ORAL | Status: DC
  Administered 2017-05-18 – 2017-05-19 (×2): 7.5 mg via ORAL
  Filled 2017-05-18 (×3): qty 1

## 2017-05-18 MED ORDER — HYDROXYZINE HCL 25 MG PO TABS: 25.0000 mg | ORAL_TABLET | Freq: Three times a day (TID) | ORAL | Status: DC | PRN

## 2017-05-18 MED ORDER — ATORVASTATIN CALCIUM 40 MG PO TABS
40.0000 mg | ORAL_TABLET | Freq: Every day | ORAL | Status: DC
  Administered 2017-05-18 – 2017-05-20 (×3): 40 mg via ORAL
  Filled 2017-05-18 (×3): qty 1

## 2017-05-18 MED ORDER — SODIUM CHLORIDE 0.9 % IV BOLUS (SEPSIS)
1000.0000 mL | Freq: Once | INTRAVENOUS | Status: AC
  Administered 2017-05-18: 1000 mL via INTRAVENOUS

## 2017-05-18 MED ORDER — ACETAMINOPHEN 650 MG RE SUPP: 650.0000 mg | Freq: Four times a day (QID) | RECTAL | Status: DC | PRN

## 2017-05-18 MED ORDER — AMLODIPINE BESYLATE 5 MG PO TABS
5.0000 mg | ORAL_TABLET | Freq: Every day | ORAL | Status: DC
  Administered 2017-05-18 – 2017-05-20 (×3): 5 mg via ORAL
  Filled 2017-05-18 (×3): qty 1

## 2017-05-18 MED ORDER — GABAPENTIN 300 MG PO CAPS
300.0000 mg | ORAL_CAPSULE | Freq: Three times a day (TID) | ORAL | Status: DC
  Administered 2017-05-18 – 2017-05-20 (×7): 300 mg via ORAL
  Filled 2017-05-18 (×5): qty 1
  Filled 2017-05-18: qty 3
  Filled 2017-05-18: qty 1

## 2017-05-18 MED ORDER — CLOPIDOGREL BISULFATE 75 MG PO TABS
75.0000 mg | ORAL_TABLET | Freq: Every day | ORAL | Status: DC
Start: 2017-05-18 — End: 2017-05-20
  Administered 2017-05-18 – 2017-05-20 (×3): 75 mg via ORAL
  Filled 2017-05-18 (×3): qty 1

## 2017-05-18 MED ORDER — ENOXAPARIN SODIUM 40 MG/0.4ML ~~LOC~~ SOLN
40.0000 mg | SUBCUTANEOUS | Status: DC
  Administered 2017-05-18 – 2017-05-20 (×3): 40 mg via SUBCUTANEOUS
  Filled 2017-05-18 (×3): qty 0.4

## 2017-05-18 MED ORDER — POTASSIUM CHLORIDE IN NACL 20-0.9 MEQ/L-% IV SOLN
INTRAVENOUS | Status: AC
  Administered 2017-05-18: 09:00:00 via INTRAVENOUS

## 2017-05-18 MED ORDER — ACETAMINOPHEN 325 MG PO TABS
650.0000 mg | ORAL_TABLET | Freq: Four times a day (QID) | ORAL | Status: DC | PRN
  Administered 2017-05-20: 650 mg via ORAL
  Filled 2017-05-18: qty 2

## 2017-05-18 MED ORDER — ASPIRIN EC 325 MG PO TBEC
325.0000 mg | DELAYED_RELEASE_TABLET | Freq: Every day | ORAL | Status: DC
  Administered 2017-05-18 – 2017-05-20 (×3): 325 mg via ORAL
  Filled 2017-05-18 (×3): qty 1

## 2017-05-18 NOTE — H&P (Signed)
History and Physical    Latoya Morris BPZ:[id number] DOB: [birth year] DOA: 05/18/2017  PCP: Kirk Ruths, MD   Patient coming from: home.  I have personally briefly reviewed patient's old medical records in [place]  Chief Complaint: Weakness, confusion and falls.  HPI: Latoya Morris is a 83 y.o. female with medical history significant of anxiety, constipation, depression, hyperlipidemia, hypertension, history of previous stroke who was brought to the emergency department via EMS after falling from her side of the bed around 0030 to 0045, spilling a glass of water, followed by weakness, transient dyspnea, confusion and inability to get up on her own. Her relatives state that she kept saying "I am so tired" or "I am so weak" multiple times. Her daughter states that she was confused earlier in the evening and complained of having chills earlier in the day. She has been having a mild nonproductive cough recently with mild dyspnea. One of her relatives had an URI about 11 days ago. However, no other sick contacts to their knowledge. She denies headache, fever, sore throat, body aches, chest pain, abdominal pain, nausea, emesis, diarrhea, melena or hematochezia. She denies dysuria, frequency or gross hematuria. No polyuria, no polydipsia or blurred vision. She occasionally gets pruritus and is taking hydroxyzine for this.  ED Course: Initial vital signs temperature 100.3F, pulse 86, respirations 18, blood pressure 161/53 mmHg in O2 sat 99% on room air. She received at 1 L normal saline bolus and ceftriaxone 1 g IVPB in the emergency department. She stated that she is feeling better.  Her workup shows an urinalysis with cloudy color, a small hemoglobinuria, large leukocyte esterase with 6-30 RBCs and TNTC WBCs with rare bacteria on microscopic exam. WBC was 11.6 with 85% neutrophils, hemoglobin 10.8 g/dL and platelets 170. Lactic acid was 2.27 mmol/L. Her CMP shows a creatinine of  1.05, calcium of 8.5 and glucose 188 mg/dL. LFTs and electrolytes were normal.Chest radiograph showed peribronchial thickening with suspected chronically increased interstitial markings.  Review of Systems: As per HPI otherwise 10 point review of systems negative.    Past Medical History:  Diagnosis Date  . Anxiety   . Constipation   . Depression   . Hyperlipidemia   . Hypertension   . Stroke [facility])    Doesn't remember year of stroke other than it not being recent    Past Surgical History:  Procedure Laterality Date  . TUBAL LIGATION       reports that she quit smoking about 3 years ago. She has a 37.50 pack-year smoking history. She has never used smokeless tobacco. Her alcohol and drug histories are not on file.  Allergies  Allergen Reactions  . Penicillins     REACTION: questionable    Family History  Problem Relation Age of Onset  . Heart disease Mother   . Stomach cancer Sister   . Throat cancer Brother   . Stroke Sister   . Dementia Sister   . Dementia Sister     Prior to Admission medications   Medication Sig Start Date End Date Taking? Authorizing Provider  amLODipine (NORVASC) 5 MG tablet Take 5 mg by mouth daily.   Yes [provider]  aspirin EC 325 MG tablet Take 325 mg by mouth daily.   Yes [provider]  atorvastatin (LIPITOR) 40 MG tablet Take 40 mg by mouth daily.   Yes [provider]  clopidogrel (PLAVIX) 75 MG tablet Take 75 mg by mouth daily.   Yes  [provider]  FLUoxetine (PROZAC) 20 MG capsule Take 20 mg by mouth daily.   Yes [provider]  fluticasone (FLONASE) 50 MCG/ACT nasal spray Place into both nostrils daily.   Yes [provider]  gabapentin (NEURONTIN) 300 MG capsule Take 300 mg by mouth 3 (three) times daily.   Yes [provider]  hydrOXYzine (ATARAX/VISTARIL) 25 MG tablet Take 25 mg by mouth 3 (three) times daily as needed.   Yes [provider]  meloxicam  (MOBIC) 7.5 MG tablet Take 7.5 mg by mouth daily.   Yes [provider]  traZODone (DESYREL) 150 MG tablet Take 250 mg by mouth at bedtime.    Yes [provider]  azelastine (ASTELIN) 0.1 % nasal spray Place 1 spray into both nostrils 2 (two) times daily. Use in each nostril as directed    [provider]  bisacodyl (DULCOLAX) 5 MG EC tablet Take 5 mg by mouth daily as needed for moderate constipation.    [provider]  donepezil (ARICEPT) 10 MG tablet Take 10 mg by mouth at bedtime.    [provider]  ipratropium (ATROVENT) 0.03 % nasal spray Place 2 sprays into both nostrils every 12 (twelve) hours.    [provider]  linaclotide (LINZESS) 290 MCG CAPS capsule Take 290 mcg by mouth daily before breakfast.    [provider]  loratadine (CLARITIN) 10 MG tablet Take 10 mg by mouth daily.    [provider]  pyrithione zinc (HEAD AND SHOULDERS) 1 % shampoo Apply topically daily as needed for itching.    [provider]    Physical Exam: Vitals:   05/18/17 0211 05/18/17 0230 05/18/17 0300 05/18/17 0330  BP: (!) 161/53 104/83 (!) 167/59 (!) 159/64  Pulse: 86 78 78 79  Resp: 18     Temp: (!) 100.5 F (38.1 C)     TempSrc: Rectal     SpO2: 99% 96% 98% 96%  Weight:      Height:        Constitutional: NAD, calm, comfortable Eyes: PERRL, lids and conjunctivae normal ENMT: Mucous membranes are mildly dry. Posterior pharynx clear of any exudate or lesions. Neck: normal, supple, no masses, no thyromegaly Respiratory: decreased breath sounds on bases, otherwiseclear to auscultation bilaterally, no wheezing, no crackles. Normal respiratory effort. No accessory muscle use.  Cardiovascular: Regular rate and rhythm, no murmurs / rubs / gallops. No extremity edema. 2+ pedal pulses. No carotid bruits.  Abdomen: . Bowel sounds positive.Obese, soft, no tenderness, no masses palpated. No hepatosplenomegaly. . Back:Mild  left-sided CVA tenderness. Musculoskeletal: no clubbing / cyanosis.  Good ROM, no contractures. Normal muscle tone.  Skin: positive small ecchymosis areas on extremities, particularly on right ankle area. Neurologic: Baseline dysarthria from previous stroke. Sensation grossly intact, DTR normal. Global weakness. Psychiatric: Normal judgment and insight. Alert and oriented x 3. Normal mood.    Labs on Admission: I have personally reviewed following labs and imaging studies  CBC:  Recent Labs Lab 05/18/17 0238  WBC 11.6*  NEUTROABS 9.9*  HGB 10.8*  HCT 34.1*  MCV 85.5  PLT 297   Basic Metabolic Panel:  Recent Labs Lab 05/18/17 0238  NA 137  K 3.5  CL 103  CO2 23  GLUCOSE 188*  BUN 14  CREATININE 1.05*  CALCIUM 8.5*   GFR: Estimated Creatinine Clearance: 43.8 mL/min (A) (by C-G formula based on SCr of 1.05 mg/dL (H)). Liver Function Tests:  Recent Labs Lab 05/18/17 [phone]  AST 24  ALT 16  ALKPHOS 68  BILITOT 0.7  PROT 6.8  ALBUMIN 3.9   No results for input(s): LIPASE, AMYLASE in the last 168 hours. No results for input(s): AMMONIA in the last 168 hours. Coagulation Profile: No results for input(s): INR, PROTIME in the last 168 hours. Cardiac Enzymes: No results for input(s): CKTOTAL, CKMB, CKMBINDEX, TROPONINI in the last 168 hours. BNP (last 3 results) No results for input(s): PROBNP in the last 8760 hours. HbA1C: No results for input(s): HGBA1C in the last 72 hours. CBG: No results for input(s): GLUCAP in the last 168 hours. Lipid Profile: No results for input(s): CHOL, HDL, LDLCALC, TRIG, CHOLHDL, LDLDIRECT in the last 72 hours. Thyroid Function Tests: No results for input(s): TSH, T4TOTAL, FREET4, T3FREE, THYROIDAB in the last 72 hours. Anemia Panel: No results for input(s): VITAMINB12, FOLATE, FERRITIN, TIBC, IRON, RETICCTPCT in the last 72 hours. Urine analysis:    Component Value Date/Time   COLORURINE YELLOW 05/18/2017 0340   APPEARANCEUR  CLOUDY (A) 05/18/2017 0340   LABSPEC 1.014 05/18/2017 0340   PHURINE 5.0 05/18/2017 0340   GLUCOSEU NEGATIVE 05/18/2017 0340   HGBUR SMALL (A) 05/18/2017 0340   BILIRUBINUR NEGATIVE 05/18/2017 0340   KETONESUR NEGATIVE 05/18/2017 0340   PROTEINUR NEGATIVE 05/18/2017 0340   NITRITE NEGATIVE 05/18/2017 0340   LEUKOCYTESUR LARGE (A) 05/18/2017 0340    Radiological Exams on Admission: Dg Chest 2 View  Result Date: 05/18/2017 CLINICAL DATA:  Acute onset of generalized weakness. Multiple recent falls. Initial encounter. EXAM: CHEST  2 VIEW COMPARISON:  None. FINDINGS: The lungs are well-aerated. Peribronchial thickening is noted. Chronically increased interstitial markings are seen. There is no evidence of pleural effusion or pneumothorax. The heart is borderline normal in size. No acute osseous abnormalities are seen. Focal density at the left lung base may reflect a remote healed rib fracture. Diffuse calcification is seen along the distal descending thoracic aorta and proximal abdominal aorta. IMPRESSION: 1. Peribronchial thickening noted. Suspect chronically increased interstitial markings. 2. No definite displaced rib fracture seen. 3. Diffuse aortic atherosclerosis. Electronically Signed   By: Garald Balding M.D.   On: 05/18/2017 03:18    EKG: Independently reviewed.   Assessment/Plan Principal Problem:   Urinary tract infection Admit to MedSurg/inpatient Continue gentle and time-limited IV fluids. Continue ceftriaxone 1 g IV PB every 24 hours. Follow-up blood cultures and sensitivity. Follow-up urine culture and sensitivity.  Active Problems:   Altered mental status Apparently secondary to fever. Improved after IV fluids and antibiotic. Close to baseline per her daughters.    Depression Continue fluoxetine 20 mg by mouth daily. Continue trazodone 250 mg by mouth at bedtime.    Essential hypertension Continue amlodipine 5 mg by mouth daily. Monitor blood pressure.     Constipation Continue Dulcolax 5 mg by mouth daily as needed.    Hyperlipidemia Continue atorvastatin 40 mg by mouth daily.    History of stroke (HCC) Continue aspirin 325 mg by mouth daily. Continue clopidogrel 75 mg by mouth daily. Continue hypertension and hyperlipidemia control.    Hyperglycemia Carbohydrate modified diet. Check hemoglobin A1c. CBG monitoring before meals.    DVT prophylaxis: Lovenox SQ. Code Status: full code. Family Communication: her daughters are present in the ED. Disposition Plan: Admit for IV antibiotics for 2-3 days. Consults called:  Admission status: inpatient/MedSurg.   Reubin Milan MD [facility] Pager [phone].  If 7PM-7AM, please contact night-coverage [email or web address] Password [email or web address]  05/18/2017, 5:46 AM

## 2017-05-18 NOTE — ED Provider Notes (Signed)
[facility] DEPT Provider Note   CSN: [id number] Arrival date & time: 05/18/17  0202     History   Chief Complaint Chief Complaint  Patient presents with  . Generalized Body Aches    HPI Latoya Morris is a 83 y.o. female.  The history is provided by the patient and a relative. The history is limited by the condition of the patient (Altered mental status).  She is living with her daughter. Tonight, she slid off of the bed and was an inability to get up on her own. Daughter had difficulty getting her up. Daughter also noted that she has been confused this evening. She has had some chills during the day. There has been a slight cough, and she seems short of breath. She denies sore throat and denies chest pain or heaviness or tightness or pressure. She denies abdominal pain, nausea, vomiting, diarrhea. She denies urinary difficulty. She denies arthralgias or myalgias.  Past Medical History:  Diagnosis Date  . Anxiety   . Depression   . Stroke [facility])    Doesn't remember year of stroke other than it not being recent    Patient Active Problem List   Diagnosis Date Noted  . SINUSITIS- ACUTE-NOS 07/24/2008  . ALLERGIC RHINITIS 07/24/2008  . ACTINIC KERATOSIS 02/23/2008  . GASTROENTERITIS 09/08/2007  . CONTUSION, LOWER LEG 03/08/2007  . LUMBOSACRAL STRAIN 03/01/2007  . DEPRESSION 10-18-202008  . HYPERTENSION 10-18-202008  . DIVERTICULOSIS, COLON 10-18-202008  . CONSTIPATION 10-18-202008  . SEBORRHEA 10-18-202008  . OSTEOARTHRITIS 10-18-202008  . OSTEOPOROSIS 10-18-202008    Past Surgical History:  Procedure Laterality Date  . TUBAL LIGATION      OB History    No data available       Home Medications    Prior to Admission medications   Medication Sig Start Date End Date Taking? Authorizing Provider  amLODipine (NORVASC) 5 MG tablet Take 5 mg by mouth daily.    [provider]  atorvastatin (LIPITOR) 40 MG tablet Take 40 mg by mouth daily.    [provider]   celecoxib (CELEBREX) 200 MG capsule Take 200 mg by mouth 2 (two) times daily.    [provider]  clopidogrel (PLAVIX) 75 MG tablet Take 75 mg by mouth daily.    [provider]  donepezil (ARICEPT) 10 MG tablet Take 10 mg by mouth at bedtime.    [provider]  FLUoxetine (PROZAC) 20 MG capsule Take 20 mg by mouth daily.    [provider]  fluticasone (FLONASE) 50 MCG/ACT nasal spray Place into both nostrils daily.    [provider]  hydrOXYzine (ATARAX/VISTARIL) 25 MG tablet Take 25 mg by mouth 3 (three) times daily as needed.    [provider]  ipratropium (ATROVENT) 0.03 % nasal spray Place 2 sprays into both nostrils every 12 (twelve) hours.    [provider]  linaclotide (LINZESS) 290 MCG CAPS capsule Take 290 mcg by mouth daily before breakfast.    [provider]  loratadine (CLARITIN) 10 MG tablet Take 10 mg by mouth daily.    [provider]  pyrithione zinc (HEAD AND SHOULDERS) 1 % shampoo Apply topically daily as needed for itching.    [provider]  traZODone (DESYREL) 150 MG tablet Take by mouth at bedtime.    [provider]    Family History History reviewed. No pertinent family history.  Social History Social History  Substance Use Topics  . Smoking status: Former Smoker  Packs/day: 1.50    Years: 25.00    Quit date: 09/21/2013  . Smokeless tobacco: Never Used  . Alcohol use Not on file     Allergies   Penicillins   Review of Systems Review of Systems  Unable to perform ROS: Mental status change  All other systems reviewed and are negative.    Physical Exam Updated Vital Signs BP (!) 161/53 (BP Location: Left Arm)   Pulse 86   Temp (!) 100.5 F (38.1 C) (Rectal)   Resp 18   Ht 5\' 3"  (1.6 m)   Wt 83.9 kg (185 lb)   SpO2 99%   BMI 32.77 kg/m   Physical Exam  Nursing note and vitals reviewed.  83 year old female, resting comfortably and in  no acute distress. Vital signs are significant for low-grade fever and hypertension. Oxygen saturation is 99%, which is normal. Head is normocephalic and atraumatic. PERRLA, EOMI. Oropharynx is clear. Mucous membranes appear dry. Neck is nontender and supple without adenopathy or JVD. Back is nontender and there is no CVA tenderness. Lungs are clear without rales, wheezes, or rhonchi. Chest is nontender. Heart has regular rate and rhythm without murmur. Abdomen is soft, flat, nontender without masses or hepatosplenomegaly and peristalsis is normoactive. Extremities have no cyanosis or edema, full range of motion is present. Skin is warm and dry without rash. Neurologic: She is awake and alert and oriented to person and place but not time, cranial nerves are intact, there are no motor or sensory deficits.  ED Treatments / Results  Labs (all labs ordered are listed, but only abnormal results are displayed) Labs Reviewed  CBC WITH DIFFERENTIAL/PLATELET - Abnormal; Notable for the following:       Result Value   WBC 11.6 (*)    Hemoglobin 10.8 (*)    HCT 34.1 (*)    Neutro Abs 9.9 (*)    Lymphs Abs 0.5 (*)    Monocytes Absolute 1.1 (*)    All other components within normal limits  COMPREHENSIVE METABOLIC PANEL - Abnormal; Notable for the following:    Glucose, Bld 188 (*)    Creatinine, Ser 1.05 (*)    Calcium 8.5 (*)    GFR calc non Af Amer 49 (*)    GFR calc Af Amer 57 (*)    All other components within normal limits  URINALYSIS, ROUTINE W REFLEX MICROSCOPIC - Abnormal; Notable for the following:    APPearance CLOUDY (*)    Hgb urine dipstick SMALL (*)    Leukocytes, UA LARGE (*)    Bacteria, UA RARE (*)    Squamous Epithelial / LPF 6-30 (*)    Non Squamous Epithelial 0-5 (*)    All other components within normal limits  I-STAT CG4 LACTIC ACID, ED - Abnormal; Notable for the following:    Lactic Acid, Venous 2.27 (*)    All other components within normal limits  CULTURE,  BLOOD (ROUTINE X 2)  CULTURE, BLOOD (ROUTINE X 2)  URINE CULTURE   Radiology Dg Chest 2 View  Result Date: 05/18/2017 CLINICAL DATA:  Acute onset of generalized weakness. Multiple recent falls. Initial encounter. EXAM: CHEST  2 VIEW COMPARISON:  None. FINDINGS: The lungs are well-aerated. Peribronchial thickening is noted. Chronically increased interstitial markings are seen. There is no evidence of pleural effusion or pneumothorax. The heart is borderline normal in size. No acute osseous abnormalities are seen. Focal density at the left lung base may reflect a remote healed rib fracture. Diffuse calcification  is seen along the distal descending thoracic aorta and proximal abdominal aorta. IMPRESSION: 1. Peribronchial thickening noted. Suspect chronically increased interstitial markings. 2. No definite displaced rib fracture seen. 3. Diffuse aortic atherosclerosis. Electronically Signed   By: Garald Balding M.D.   On: 05/18/2017 03:18    Procedures Procedures (including critical care time)  Medications Ordered in ED Medications  cefTRIAXone (ROCEPHIN) 1 g in dextrose 5 % 50 mL IVPB (1 g Intravenous New Bag/Given 05/18/17 0452)  sodium chloride 0.9 % bolus 1,000 mL (1,000 mLs Intravenous New Bag/Given 05/18/17 0357)     Initial Impression / Assessment and Plan / ED Course  I have reviewed the triage vital signs and the nursing notes.  Pertinent labs & imaging results that were available during my care of the patient were reviewed by me and considered in my medical decision making (see chart for details).  Fever and weakness. She does not appear overtly septic, but certainly I'm concerned about infection. Old records are reviewed, and she is followed by a neurologist for tremor and a tendency to fall. Blood cultures are obtained and chest x-ray will be obtained as well as urinalysis.  Chest x-ray shows no evidence of pneumonia. Laboratory workup shows mild anemia. Urinalysis has too  numerous to count WBCs and likely represents a UTI to account for her fever and weakness. Lactate is mildly elevated at 2.27. She is given 1 L of fluid. With mild elevation of lactic acid and normal blood pressure and heart rate, I do not feel she needs the 30 mL/kg bolus for use in sepsis. She is given a dose of ceftriaxone. Case is discussed with Dr. Olevia Bowens of [facility] who agrees to admit the patient.  Final Clinical Impressions(s) / ED Diagnoses   Final diagnoses:  Urinary tract infection without hematuria, site unspecified  Normochromic normocytic anemia  Elevated lactic acid level    New Prescriptions New Prescriptions   No medications on file     Delora Fuel, MD 58/83/25 [phone]

## 2017-05-18 NOTE — Progress Notes (Signed)
05/18/2017  2:04 AM  05/18/2017 10:12 AM  Latoya Morris was seen and examined.  The H&P by the admitting provider, orders, imaging was reviewed.  Please see new orders.  Will continue to follow.   Murvin Natal, MD [facility]

## 2017-05-18 NOTE — ED Triage Notes (Signed)
Pt brought in by [facility] forc/o generalized weakness over the last day; pt has been falling more than her normal; pt is alert and oriented and denies any pain

## 2017-05-18 NOTE — Evaluation (Signed)
Physical Therapy Evaluation Patient Details Name: Latoya Morris MRN: [id number] DOB: 1934-02-29 Today's Date: 05/18/2017   History of Present Illness  Latoya Morris is a 83 y.o. female with medical history significant of anxiety, constipation, depression, hyperlipidemia, hypertension, history of previous stroke who was brought to the emergency department via EMS after falling from her side of the bed around 0030 to 0045, spilling a glass of water, followed by weakness, transient dyspnea, confusion and inability to get up on her own. Her relatives state that she kept saying "I am so tired" or "I am so weak" multiple times. Her daughter states that she was confused earlier in the evening and complained of having chills earlier in the day. She has been having a mild nonproductive cough recently with mild dyspnea. One of her relatives had an URI about 11 days ago. However, no other sick contacts to their knowledge. She denies headache, fever, sore throat, body aches, chest pain, abdominal pain, nausea, emesis, diarrhea, melena or hematochezia. She denies dysuria, frequency or gross hematuria. No polyuria, no polydipsia or blurred vision. She occasionally gets pruritus and is taking hydroxyzine for this.    Clinical Impression  Patient demonstrates slightly labored slower than normal cadence using SPC without loss of balance, limited secondary to c/o fatigue, near baseline, but has difficulty sitting up at bedside due to generalized weakness.  Patient will benefit from continued physical therapy in hospital and recommended venue below to increase strength, balance, endurance for safe ADLs and gait.    Follow Up Recommendations Home health PT;Supervision for mobility/OOB    Equipment Recommendations  None recommended by PT    Recommendations for Other Services       Precautions / Restrictions Precautions Precautions: Fall Restrictions Weight Bearing Restrictions: No      Mobility  Bed  Mobility Overal bed mobility: Needs Assistance Bed Mobility: Supine to Sit;Sit to Supine     Supine to sit: Min assist Sit to supine: Min guard      Transfers Overall transfer level: Needs assistance Equipment used: Straight cane Transfers: Sit to/from [facility] Sit to Stand: Min guard Stand pivot transfers: Min guard          Ambulation/Gait Ambulation/Gait assistance: Min guard Ambulation Distance (Feet): 35 Feet Assistive device: Straight cane Gait Pattern/deviations: Decreased step length - right;Decreased step length - left;Decreased stride length   Gait velocity interpretation: Below normal speed for age/gender General Gait Details: demonstrates 3 point gait pattern without loss of balance, slightly labored cadence, limited secondary to c/o fatigue  Stairs            Wheelchair Mobility    Modified Rankin (Stroke Patients Only)       Balance Overall balance assessment: Needs assistance Sitting-balance support: Feet supported;No upper extremity supported Sitting balance-Leahy Scale: Good     Standing balance support: Single extremity supported;During functional activity Standing balance-Leahy Scale: Fair                               Pertinent Vitals/Pain Pain Assessment: No/denies pain    Home Living Family/patient expects to be discharged to:: Private residence Living Arrangements: Children Available Help at Discharge: Family Type of Home: House Home Access: Ramped entrance (has bilateral side rails on ramp, can reach both)     Home Layout: One level Home Equipment: Walker - 2 wheels;Cane - single point;Shower seat      Prior Function Level of Independence:  Independent with assistive device(s)         Comments: uses SPC PRN, otherwise hosehold gait without assisitve device     Hand Dominance        Extremity/Trunk Assessment   Upper Extremity Assessment Upper Extremity Assessment: Generalized  weakness    Lower Extremity Assessment Lower Extremity Assessment: Generalized weakness    Cervical / Trunk Assessment Cervical / Trunk Assessment: Normal  Communication   Communication: No difficulties  Cognition Arousal/Alertness: Awake/alert Behavior During Therapy: WFL for tasks assessed/performed Overall Cognitive Status: Within Functional Limits for tasks assessed                                        General Comments      Exercises     Assessment/Plan    PT Assessment Patient needs continued PT services  PT Problem List Decreased strength;Decreased activity tolerance;Decreased balance;Decreased mobility       PT Treatment Interventions Gait training;Functional mobility training;Therapeutic activities;Therapeutic exercise;Patient/family education    PT Goals (Current goals can be found in the Care Plan section)  Acute Rehab PT Goals Patient Stated Goal: return home PT Goal Formulation: With patient Time For Goal Achievement: 05/25/17 Potential to Achieve Goals: Good    Frequency Min 3X/week   Barriers to discharge        Co-evaluation               AM-PAC PT "6 Clicks" Daily Activity  Outcome Measure Difficulty turning over in bed (including adjusting bedclothes, sheets and blankets)?: A Little Difficulty moving from lying on back to sitting on the side of the bed? : A Little Difficulty sitting down on and standing up from a chair with arms (e.g., wheelchair, bedside commode, etc,.)?: A Little Help needed moving to and from a bed to chair (including a wheelchair)?: A Little Help needed walking in hospital room?: A Little Help needed climbing 3-5 steps with a railing? : A Lot 6 Click Score: 17    End of Session Equipment Utilized During Treatment: Gait belt Activity Tolerance: Patient limited by fatigue Patient left: in chair;with call bell/phone within reach Nurse Communication: Mobility status PT Visit Diagnosis: Unsteadiness  on feet (R26.81);Other abnormalities of gait and mobility (R26.89);Muscle weakness (generalized) (M62.81)    Time: 4970-2637 PT Time Calculation (min) (ACUTE ONLY): 29 min   Charges:   PT Evaluation $PT Eval Low Complexity: 1 Low PT Treatments $Therapeutic Activity: 23-37 mins   PT G Codes:   PT G-Codes **NOT FOR INPATIENT CLASS** Functional Assessment Tool Used: AM-PAC 6 Clicks Basic Mobility Functional Limitation: Mobility: Walking and moving around Mobility: Walking and Moving Around Current Status (C5885): At least 40 percent but less than 60 percent impaired, limited or restricted Mobility: Walking and Moving Around Goal Status [phone]): At least 40 percent but less than 60 percent impaired, limited or restricted Mobility: Walking and Moving Around Discharge Status [phone]): At least 40 percent but less than 60 percent impaired, limited or restricted    11:33 AM, 05/18/17 Lonell Grandchild, MPT Physical Therapist with [facility] [phone] office [phone] mobile phone

## 2017-05-18 NOTE — Care Management Note (Signed)
Case Management Note  Patient Details  Name: Latoya Morris MRN: [id number] Date of Birth: 09-21-1933  Subjective/Objective:   Adm with UTI. From home with daughter. She reports being mostly ind with ADL's (daughter stays in bathroom while she showers in case she needs help), walks with a cane. She states daughter takes her to appointments. Recommended for [facility] PT.                  Action/Plan: Patient declines Home health. She is worried about having two large dogs In the house. CM discussed PT would only be there 2-3 week for 45 min at time and asked could dogs be put into another room during those times. She still declines. [place] with care of daughter.    Expected Discharge Date:       05/19/2017           Expected Discharge Plan:  Home/Self Care  In-House Referral:     Discharge planning Services  CM Consult  Post Acute Care Choice:    Choice offered to:  Patient  DME Arranged:    DME Agency:     HH Arranged:  Patient Refused [place] Agency:     Status of Service:  Completed, signed off  If discussed at [employer] of Stay Meetings, dates discussed:    Additional Comments:  Krishawna Stiefel, Chauncey Reading, RN 05/18/2017, 1:38 PM

## 2017-05-19 ENCOUNTER — Inpatient Hospital Stay (HOSPITAL_COMMUNITY): Payer: Medicare Other

## 2017-05-19 DIAGNOSIS — I10 Essential (primary) hypertension: Secondary | ICD-10-CM

## 2017-05-19 DIAGNOSIS — R739 Hyperglycemia, unspecified: Secondary | ICD-10-CM

## 2017-05-19 DIAGNOSIS — E785 Hyperlipidemia, unspecified: Secondary | ICD-10-CM

## 2017-05-19 DIAGNOSIS — K5900 Constipation, unspecified: Secondary | ICD-10-CM

## 2017-05-19 LAB — URINE CULTURE

## 2017-05-19 LAB — GLUCOSE, CAPILLARY
GLUCOSE-CAPILLARY: 90 mg/dL (ref 65–99)
GLUCOSE-CAPILLARY: 130 mg/dL — AB (ref 65–99)
GLUCOSE-CAPILLARY: 106 mg/dL — AB (ref 65–99)
Glucose-Capillary: 102 mg/dL — ABNORMAL HIGH (ref 65–99)

## 2017-05-19 LAB — CBC WITH DIFFERENTIAL/PLATELET
BASOS ABS: 0 10*3/uL (ref 0.0–0.1)
Basophils Relative: 0 %
EOS PCT: 2 %
Eosinophils Absolute: 0.2 10*3/uL (ref 0.0–0.7)
HEMATOCRIT: 32.9 % — AB (ref 36.0–46.0)
Hemoglobin: 10.4 g/dL — ABNORMAL LOW (ref 12.0–15.0)
LYMPHS PCT: 19 %
Lymphs Abs: 1.6 10*3/uL (ref 0.7–4.0)
MCH: 27.4 pg (ref 26.0–34.0)
MCHC: 31.6 g/dL (ref 30.0–36.0)
MCV: 86.6 fL (ref 78.0–100.0)
MONO ABS: 1 10*3/uL (ref 0.1–1.0)
MONOS PCT: 12 %
NEUTROS ABS: 5.8 10*3/uL (ref 1.7–7.7)
Neutrophils Relative %: 67 %
PLATELETS: 168 10*3/uL (ref 150–400)
RBC: 3.8 MIL/uL — ABNORMAL LOW (ref 3.87–5.11)
RDW: 15.1 % (ref 11.5–15.5)
WBC: 8.6 10*3/uL (ref 4.0–10.5)

## 2017-05-19 LAB — BASIC METABOLIC PANEL
ANION GAP: 8 (ref 5–15)
BUN: 11 mg/dL (ref 6–20)
CALCIUM: 8.3 mg/dL — AB (ref 8.9–10.3)
CO2: 25 mmol/L (ref 22–32)
CREATININE: 0.82 mg/dL (ref 0.44–1.00)
Chloride: 108 mmol/L (ref 101–111)
GFR calc Af Amer: >60 mL/min (ref >60)
GLUCOSE: 109 mg/dL — AB (ref 65–99)
Potassium: 4.4 mmol/L (ref 3.5–5.1)
Sodium: 141 mmol/L (ref 135–145)

## 2017-05-19 LAB — LACTIC ACID, PLASMA: Lactic Acid, Venous: 1.1 mmol/L (ref 0.5–1.9)

## 2017-05-19 IMAGING — CR DG CHEST 1V PORT
1 series · 1 of 1 positions shown · non-contrast
Comparison: [DATE]

CLINICAL DATA: Sepsis

EXAM:
PORTABLE CHEST 1 VIEW

[portable]
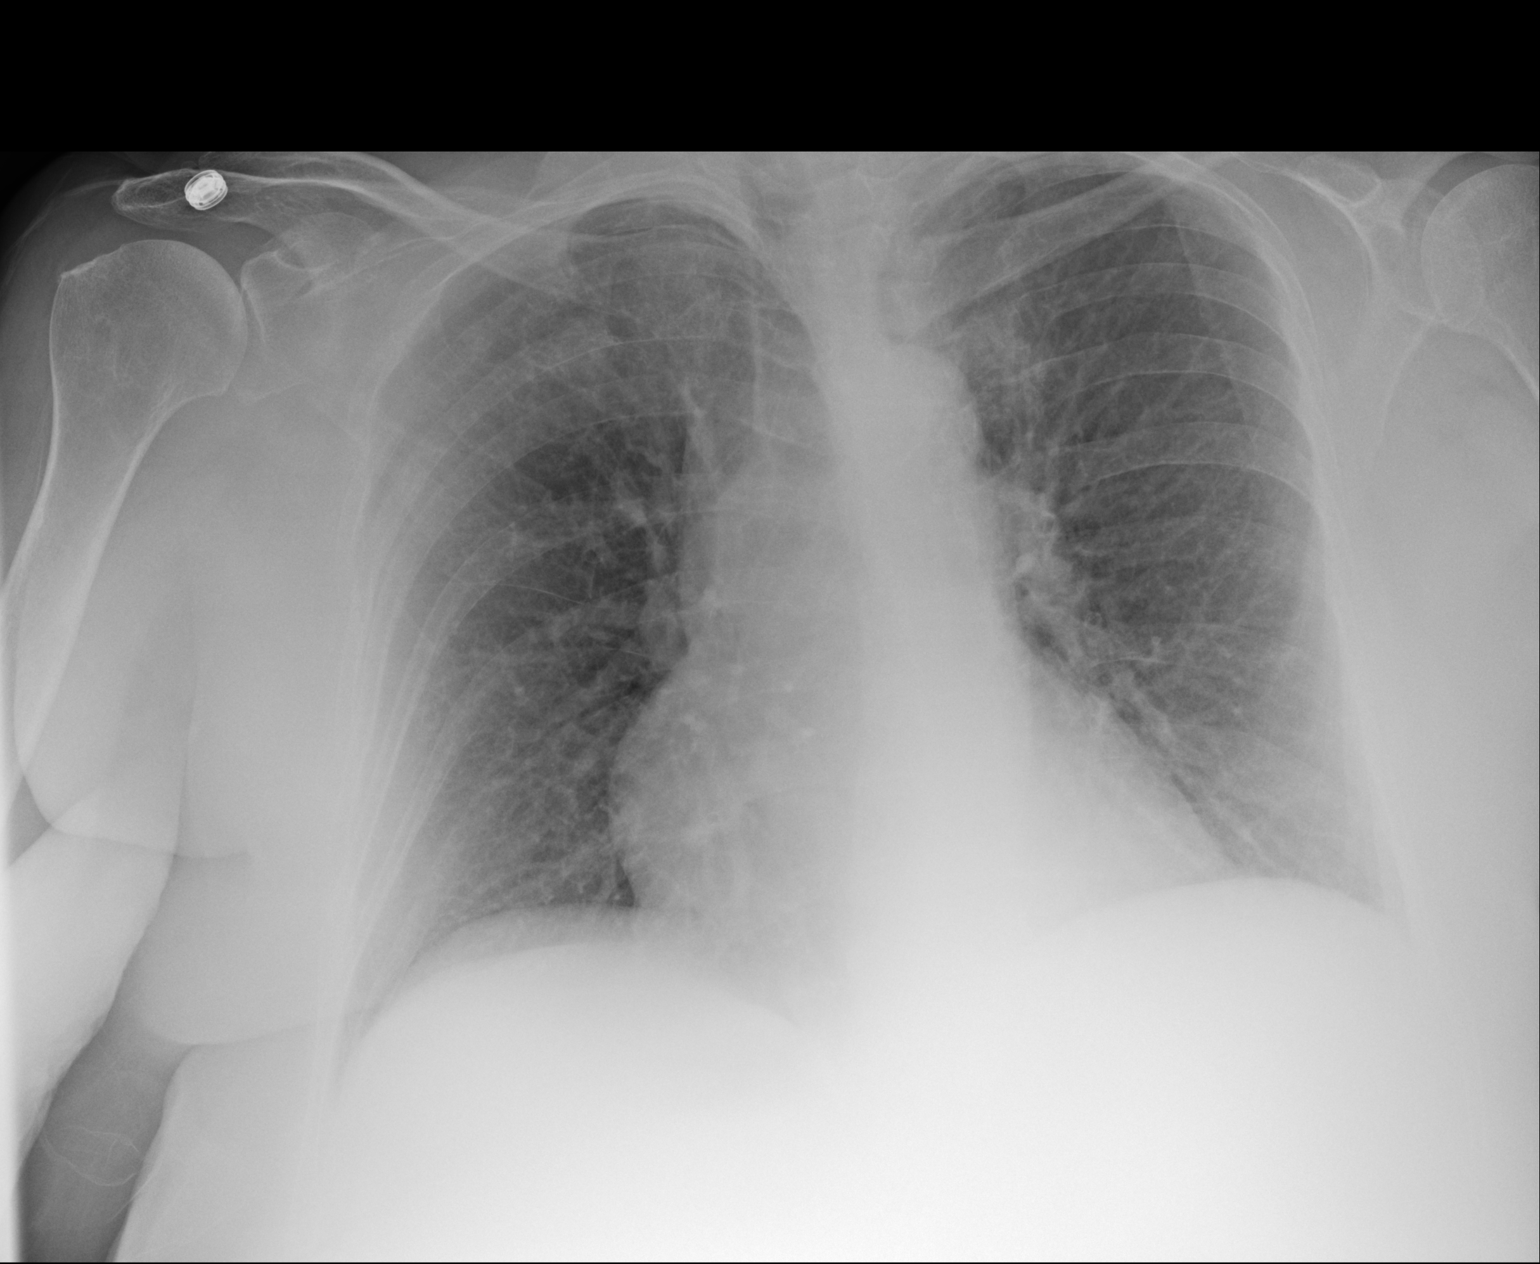

[1 of 1 positions shown; findings below may reference images not displayed]

FINDINGS: Mild peribronchial thickening. Heart is borderline in size. No
confluent airspace opacities or effusions. No acute bony
abnormality.
IMPRESSION: Mild bronchitic changes.

## 2017-05-19 MED ORDER — LORATADINE 10 MG PO TABS
10.0000 mg | ORAL_TABLET | Freq: Every day | ORAL | Status: DC
  Administered 2017-05-19 – 2017-05-20 (×2): 10 mg via ORAL
  Filled 2017-05-19 (×2): qty 1

## 2017-05-19 MED ORDER — PANTOPRAZOLE SODIUM 40 MG PO TBEC
40.0000 mg | DELAYED_RELEASE_TABLET | Freq: Every day | ORAL | Status: DC
  Administered 2017-05-19 – 2017-05-20 (×2): 40 mg via ORAL
  Filled 2017-05-19 (×2): qty 1

## 2017-05-19 MED ORDER — DONEPEZIL HCL 5 MG PO TABS
10.0000 mg | ORAL_TABLET | Freq: Every day | ORAL | Status: DC
  Administered 2017-05-19: 10 mg via ORAL
  Filled 2017-05-19: qty 2

## 2017-05-19 NOTE — Progress Notes (Signed)
PROGRESS NOTE  Latoya Morris  EHM:[id number]  DOB: 01-19-1934  DOA: 05/18/2017 PCP: Kirk Ruths, MD   Brief Admission Hx: Latoya Morris is a 83 y.o. female with medical history significant of anxiety, constipation, depression, hyperlipidemia, hypertension, history of previous stroke who was brought to the emergency department via EMS after falling from her side of the bed around 0030 to 0045, spilling a glass of water, followed by weakness, transient dyspnea, confusion and inability to get up on her own.  MDM/Assessment & Plan:   1. Multiple falls at home  -  Pt and daughter report at least 4-5 major falls at home while patient was home alone and daughter was working. They are interested in short term rehab. Will ask for social worker to investigate possibility.  I'm concerned about her falling and having a major injury at home being alone for a lot of time.   2. UTI - improving clinically with IV ceftriaxone, will continue it today and follow blood and urine cultures.  3. AMS - secondary to fall, fever and dehydration - resolved now and back to baseline mentation per daughter. 4. Depression - resume home fluoxetine and trazodone.  5. Essential hypertesnio - resume home amlodipine daily.  6. Constipation - dulcolax ordered.   7. Hyperlipidemia - resumed home atorvastatin 40 mg daily.  8. History of CVA - resume aspirin, plavix for secondary prevention.  9. Stress hyperglycemia - A1c was 5.6%.  Resolved now.   DVT prophylaxis: lovenox Code Status: Full  Family Communication: daughter Disposition Plan: Discharge tomorrow  Subjective: Pt says that she is still very weak when ambulating to the bathroom, interested in SNF rehab  Objective: Vitals:   05/18/17 1300 05/18/17 2125 05/18/17 2159 05/19/17 0449  BP: 104/62  (!) 143/69 (!) 119/95  Pulse: 77  88 64  Resp: 16  17 18   Temp: 98.8 F (37.1 C)  99 F (37.2 C) 98.7 F (37.1 C)  TempSrc: Oral  Oral Oral  SpO2: 98%  92% 97% 96%  Weight:      Height:        Intake/Output Summary (Last 24 hours) at 05/19/17 0913 Last data filed at 05/19/17 0400  Gross per 24 hour  Intake           957.33 ml  Output              300 ml  Net           657.33 ml   Filed Weights   05/18/17 0209 05/18/17 0644  Weight: 83.9 kg (185 lb) 85.4 kg (188 lb 4.4 oz)     REVIEW OF SYSTEMS  As per history otherwise all reviewed and reported negative  Exam:  General exam: awake, alert, NAD, oriented.  Respiratory system: No increased work of breathing. Cardiovascular system: S1 & S2 heard, RRR. No JVD, murmurs, gallops, clicks or pedal edema. Gastrointestinal system: Abdomen is nondistended, soft and nontender. Normal bowel sounds heard. Central nervous system: Alert and oriented.  Extremities: no cyanosis.   Data Reviewed: Basic Metabolic Panel:  Recent Labs Lab 05/18/17 0238 05/19/17 0424  NA 137 141  K 3.5 4.4  CL 103 108  CO2 23 25  GLUCOSE 188* 109*  BUN 14 11  CREATININE 1.05* 0.82  CALCIUM 8.5* 8.3*  MG 1.7  --    Liver Function Tests:  Recent Labs Lab 05/18/17 0238  AST 24  ALT 16  ALKPHOS 68  BILITOT 0.7  PROT 6.8  ALBUMIN 3.9   No results for input(s): LIPASE, AMYLASE in the last 168 hours. No results for input(s): AMMONIA in the last 168 hours. CBC:  Recent Labs Lab 05/18/17 0238 05/19/17 0424  WBC 11.6* 8.6  NEUTROABS 9.9* 5.8  HGB 10.8* 10.4*  HCT 34.1* 32.9*  MCV 85.5 86.6  PLT 170 168   Cardiac Enzymes: No results for input(s): CKTOTAL, CKMB, CKMBINDEX, TROPONINI in the last 168 hours. CBG (last 3)   Recent Labs  05/18/17 1620 05/18/17 2202 05/19/17 0744  GLUCAP 113* 118* 102*   Recent Results (from the past 240 hour(s))  Culture, blood (routine x 2)     Status: None (Preliminary result)   Collection Time: 05/18/17  2:38 AM  Result Value Ref Range Status   Specimen Description BLOOD LEFT ARM  Final   Special Requests   Final    BOTTLES DRAWN AEROBIC AND  ANAEROBIC Blood Culture adequate volume   Culture NO GROWTH 1 DAY  Final   Report Status PENDING  Incomplete  Culture, blood (routine x 2)     Status: None (Preliminary result)   Collection Time: 05/18/17  2:46 AM  Result Value Ref Range Status   Specimen Description BLOOD LEFT HAND  Final   Special Requests   Final    BOTTLES DRAWN AEROBIC AND ANAEROBIC Blood Culture adequate volume   Culture NO GROWTH 1 DAY  Final   Report Status PENDING  Incomplete  Urine culture     Status: Abnormal   Collection Time: 05/18/17  3:40 AM  Result Value Ref Range Status   Specimen Description URINE, CLEAN CATCH  Final   Special Requests NONE  Final   Culture MULTIPLE SPECIES PRESENT, SUGGEST RECOLLECTION (A)  Final   Report Status 05/19/2017 FINAL  Final    Studies: Dg Chest 2 View  Result Date: 05/18/2017 CLINICAL DATA:  Acute onset of generalized weakness. Multiple recent falls. Initial encounter. EXAM: CHEST  2 VIEW COMPARISON:  None. FINDINGS: The lungs are well-aerated. Peribronchial thickening is noted. Chronically increased interstitial markings are seen. There is no evidence of pleural effusion or pneumothorax. The heart is borderline normal in size. No acute osseous abnormalities are seen. Focal density at the left lung base may reflect a remote healed rib fracture. Diffuse calcification is seen along the distal descending thoracic aorta and proximal abdominal aorta. IMPRESSION: 1. Peribronchial thickening noted. Suspect chronically increased interstitial markings. 2. No definite displaced rib fracture seen. 3. Diffuse aortic atherosclerosis. Electronically Signed   By: Garald Balding M.D.   On: 05/18/2017 03:18   Scheduled Meds: . amLODipine  5 mg Oral Daily  . aspirin EC  325 mg Oral Daily  . atorvastatin  40 mg Oral Daily  . azelastine  1 spray Each Nare BID  . clopidogrel  75 mg Oral Daily  . enoxaparin (LOVENOX) injection  40 mg Subcutaneous Q24H  . FLUoxetine  20 mg Oral Daily  .  gabapentin  300 mg Oral TID  . Influenza vac split quadrivalent PF  0.5 mL Intramuscular Tomorrow-1000  . meloxicam  7.5 mg Oral Daily  . traZODone  250 mg Oral QHS   Continuous Infusions: . cefTRIAXone (ROCEPHIN)  IV Stopped (05/19/17 0342)    Principal Problem:   Urinary tract infection Active Problems:   Depression   Essential hypertension   Constipation   Altered mental status   Hyperlipidemia   Stroke ([place])   Hyperglycemia  Time spent:   Irwin Brakeman, MD, FAAFP [facility] Pager  [phone]  If 7PM-7AM, please contact night-coverage [email or web address] Password [email or web address] 05/19/2017, 9:13 AM    LOS: 1 day

## 2017-05-19 NOTE — Clinical Social Work Note (Signed)
[facility] is not recommended for SNF. She is recommended for HHPT.  Patient also does not have the required three night stay for SNF.    LCSW signing off.     Sherylann Vangorden, Clydene Pugh, LCSW

## 2017-05-20 DIAGNOSIS — R4182 Altered mental status, unspecified: Secondary | ICD-10-CM

## 2017-05-20 LAB — GLUCOSE, CAPILLARY
GLUCOSE-CAPILLARY: 115 mg/dL — AB (ref 65–99)
GLUCOSE-CAPILLARY: 100 mg/dL — AB (ref 65–99)

## 2017-05-20 MED ORDER — ACETAMINOPHEN 325 MG PO TABS: 650.0000 mg | ORAL_TABLET | Freq: Four times a day (QID) | ORAL | Status: DC | PRN

## 2017-05-20 MED ORDER — PANTOPRAZOLE SODIUM 40 MG PO TBEC: 40.0000 mg | DELAYED_RELEASE_TABLET | Freq: Every day | ORAL | Status: DC

## 2017-05-20 MED ORDER — PANTOPRAZOLE SODIUM 40 MG PO TBEC: 40.0000 mg | DELAYED_RELEASE_TABLET | Freq: Every day | ORAL | 0 refills | Status: DC

## 2017-05-20 MED ORDER — ALBUTEROL SULFATE (2.5 MG/3ML) 0.083% IN NEBU: 2.5000 mg | INHALATION_SOLUTION | Freq: Four times a day (QID) | RESPIRATORY_TRACT | 12 refills | Status: DC | PRN

## 2017-05-20 MED ORDER — ALBUTEROL SULFATE (2.5 MG/3ML) 0.083% IN NEBU: 2.5000 mg | INHALATION_SOLUTION | Freq: Four times a day (QID) | RESPIRATORY_TRACT | 0 refills | Status: AC | PRN

## 2017-05-20 NOTE — Care Management Important Message (Signed)
Important Message  Patient Details  Name: Latoya Morris MRN: [id number] Date of Birth: Jun 04, 1934   Medicare Important Message Given:  Yes    Sherald Barge, RN 05/20/2017, 10:08 AM

## 2017-05-20 NOTE — Care Management (Signed)
    Durable Medical Equipment        Start     Ordered   05/20/17 1557  For home use only DME Nebulizer machine  Once    Question:  Patient needs a nebulizer to treat with the following condition  Answer:  SOB (shortness of breath)   05/20/17 1557

## 2017-05-20 NOTE — Discharge Summary (Addendum)
Physician Discharge Summary  Latoya Morris WUJ:[id number] DOB: 31-Mar-1934 DOA: 05/18/2017  PCP: Kirk Ruths, MD  Admit date: 05/18/2017 Discharge date: 05/20/2017  Admitted From: Home  Disposition: Home (refuses [place] services) **Addendum: Pt changed her mind and decided to accept home health services.   Recommendations for Outpatient Follow-up:  1. Follow up with PCP in 1 weeks 2. Please obtain BMP/CBC in one week:  Home Health: refuses home health services  Discharge Condition: STABLE   CODE STATUS: FULL    Brief Hospitalization Summary: Please see all hospital notes, images, labs for full details of the hospitalization.  HPI: Latoya Morris is a 83 y.o. female with medical history significant of anxiety, constipation, depression, hyperlipidemia, hypertension, history of previous stroke who was brought to the emergency department via EMS after falling from her side of the bed around 0030 to 0045, spilling a glass of water, followed by weakness, transient dyspnea, confusion and inability to get up on her own. Her relatives state that she kept saying "I am so tired" or "I am so weak" multiple times. Her daughter states that she was confused earlier in the evening and complained of having chills earlier in the day. She has been having a mild nonproductive cough recently with mild dyspnea. One of her relatives had an URI about 11 days ago. However, no other sick contacts to their knowledge. She denies headache, fever, sore throat, body aches, chest pain, abdominal pain, nausea, emesis, diarrhea, melena or hematochezia. She denies dysuria, frequency or gross hematuria. No polyuria, no polydipsia or blurred vision. She occasionally gets pruritus and is taking hydroxyzine for this.  ED Course: Initial vital signs temperature 100.66F, pulse 86, respirations 18, blood pressure 161/53 mmHg in O2 sat 99% on room air. She received at 1 L normal saline bolus and ceftriaxone 1 g IVPB in the  emergency department. She stated that she is feeling better.  Her workup shows an urinalysis with cloudy color, a small hemoglobinuria, large leukocyte esterase with 6-30 RBCs and TNTC WBCs with rare bacteria on microscopic exam. WBC was 11.6 with 85% neutrophils, hemoglobin 10.8 g/dL and platelets 170. Lactic acid was 2.27 mmol/L. Her CMP shows a creatinine of 1.05, calcium of 8.5 and glucose 188 mg/dL. LFTs and electrolytes were normal.Chest radiograph showed peribronchial thickening with suspected chronically increased interstitial markings.  Brief Admission Hx: Latoya Morris a 83 y.o.femalewith medical history significant of anxiety, constipation, depression, hyperlipidemia, hypertension, history of previous stroke who was brought to the emergency department via EMS after falling from her side of the bed around 0030 to 0045, spilling a glass of water, followed by weakness, transient dyspnea, confusion and inability to get up on her own.  MDM/Assessment & Plan:   1. Multiple falls at home  -  Pt and daughter report at least 4-5 major falls at home while patient was home alone and daughter was working. They are interested in short term rehab. Will ask for social worker to investigate possibility.  I'm concerned about her falling and having a major injury at home being alone for a lot of time. She refuses home health services.  She is interested in SNF placement. Social worker says she has not had a qualifying 3 day stay to be placed through hospital. She will need to pay privately or go home.  I counseled her to please accept home health service so that she can have PT/and social worker to observe and possibly arrange SNF from home but she declined.  2. UTI - urine culture did not show significant growth, improved clinically was treated with IV ceftriaxone, discontinue at discharge.  3. Acute bronchitis - likely viral, treated supportively.   4. AMS - resolved now. Back to baseline.  secondary  to fall, fever and dehydration - resolved now and back to baseline mentation per daughter. 5. Depression - resume home fluoxetine and trazodone.  6. Essential hypertension - resume home amlodipine daily.  7. Constipation - dulcolax ordered.   8. Hyperlipidemia - resumed home atorvastatin 40 mg daily.  9. History of CVA - resume aspirin, plavix for secondary prevention.  10. Stress hyperglycemia - A1c was 5.6%.  Resolved now.   DVT prophylaxis: lovenox Code Status: Full  Family Communication: daughter Disposition Plan: Discharge  Discharge Diagnoses:  Principal Problem:   Urinary tract infection Active Problems:   Depression   Essential hypertension   Constipation   Altered mental status   Hyperlipidemia   Stroke [facility])   Hyperglycemia  Discharge Instructions: Discharge Instructions    Call MD for:  extreme fatigue    Complete by:  As directed    Call MD for:  hives    Complete by:  As directed    Call MD for:  persistant dizziness or light-headedness    Complete by:  As directed    Call MD for:  persistant nausea and vomiting    Complete by:  As directed    Call MD for:  severe uncontrolled pain    Complete by:  As directed    Diet - low sodium heart healthy    Complete by:  As directed    Increase activity slowly    Complete by:  As directed      Allergies as of 05/20/2017      Reactions   Penicillins    REACTION: questionable      Medication List    STOP taking these medications   fluticasone 50 MCG/ACT nasal spray Commonly known as:  FLONASE   ipratropium 0.03 % nasal spray Commonly known as:  ATROVENT   meloxicam 7.5 MG tablet Commonly known as:  MOBIC   triamcinolone ointment 0.1 % Commonly known as:  KENALOG     TAKE these medications   acetaminophen 325 MG tablet Commonly known as:  TYLENOL Take 2 tablets (650 mg total) by mouth every 6 (six) hours as needed for mild pain, moderate pain or fever (or Fever >/= 101).   albuterol (2.5 MG/3ML)  0.083% nebulizer solution Commonly known as:  PROVENTIL Take 3 mLs (2.5 mg total) by nebulization every 6 (six) hours as needed for wheezing or shortness of breath.   amLODipine 5 MG tablet Commonly known as:  NORVASC Take 5 mg by mouth daily.   aspirin EC 325 MG tablet Take 325 mg by mouth daily.   atorvastatin 40 MG tablet Commonly known as:  LIPITOR Take 40 mg by mouth daily.   azelastine 0.1 % nasal spray Commonly known as:  ASTELIN Place 1 spray into both nostrils 2 (two) times daily. Use in each nostril as directed   bisacodyl 5 MG EC tablet Commonly known as:  DULCOLAX Take 5 mg by mouth daily as needed for moderate constipation.   clopidogrel 75 MG tablet Commonly known as:  PLAVIX Take 75 mg by mouth daily.   donepezil 10 MG tablet Commonly known as:  ARICEPT Take 10 mg by mouth at bedtime.   FLUoxetine 20 MG capsule Commonly known as:  PROZAC Take 20 mg by  mouth daily.   gabapentin 300 MG capsule Commonly known as:  NEURONTIN Take 300 mg by mouth 3 (three) times daily.   hydrOXYzine 25 MG tablet Commonly known as:  ATARAX/VISTARIL Take 25 mg by mouth 3 (three) times daily as needed.   LINZESS 290 MCG Caps capsule Generic drug:  linaclotide Take 290 mcg by mouth daily before breakfast.   loratadine 10 MG tablet Commonly known as:  CLARITIN Take 10 mg by mouth daily.   pantoprazole 40 MG tablet Commonly known as:  PROTONIX Take 1 tablet (40 mg total) by mouth daily.   pyrithione zinc 1 % shampoo Commonly known as:  HEAD AND SHOULDERS Apply topically daily as needed for itching.   traZODone 150 MG tablet Commonly known as:  DESYREL Take 250 mg by mouth at bedtime.      Follow-up Information    Kirk Ruths, MD. Schedule an appointment as soon as possible for a visit in 2 week(s).   Specialty:  Internal Medicine Why:  Hospital Follow Up Contact information: [place]  [id number] [phone]          Allergies  Allergen Reactions  . Penicillins     REACTION: questionable   Current Discharge Medication List    START taking these medications   Details  acetaminophen (TYLENOL) 325 MG tablet Take 2 tablets (650 mg total) by mouth every 6 (six) hours as needed for mild pain, moderate pain or fever (or Fever >/= 101).    albuterol (PROVENTIL) (2.5 MG/3ML) 0.083% nebulizer solution Take 3 mLs (2.5 mg total) by nebulization every 6 (six) hours as needed for wheezing or shortness of breath. Qty: 75 mL, Refills: 12    pantoprazole (PROTONIX) 40 MG tablet Take 1 tablet (40 mg total) by mouth daily.      CONTINUE these medications which have NOT CHANGED   Details  amLODipine (NORVASC) 5 MG tablet Take 5 mg by mouth daily.    aspirin EC 325 MG tablet Take 325 mg by mouth daily.    atorvastatin (LIPITOR) 40 MG tablet Take 40 mg by mouth daily.    clopidogrel (PLAVIX) 75 MG tablet Take 75 mg by mouth daily.    FLUoxetine (PROZAC) 20 MG capsule Take 20 mg by mouth daily.    gabapentin (NEURONTIN) 300 MG capsule Take 300 mg by mouth 3 (three) times daily.    hydrOXYzine (ATARAX/VISTARIL) 25 MG tablet Take 25 mg by mouth 3 (three) times daily as needed.    traZODone (DESYREL) 150 MG tablet Take 250 mg by mouth at bedtime.     azelastine (ASTELIN) 0.1 % nasal spray Place 1 spray into both nostrils 2 (two) times daily. Use in each nostril as directed    bisacodyl (DULCOLAX) 5 MG EC tablet Take 5 mg by mouth daily as needed for moderate constipation.    donepezil (ARICEPT) 10 MG tablet Take 10 mg by mouth at bedtime.    linaclotide (LINZESS) 290 MCG CAPS capsule Take 290 mcg by mouth daily before breakfast.    loratadine (CLARITIN) 10 MG tablet Take 10 mg by mouth daily.    pyrithione zinc (HEAD AND SHOULDERS) 1 % shampoo Apply topically daily as needed for itching.      STOP taking these medications     fluticasone (FLONASE) 50 MCG/ACT nasal spray       meloxicam (MOBIC) 7.5 MG tablet      triamcinolone ointment (KENALOG) 0.1 %  ipratropium (ATROVENT) 0.03 % nasal spray         Procedures/Studies: Dg Chest 2 View  Result Date: 05/18/2017 CLINICAL DATA:  Acute onset of generalized weakness. Multiple recent falls. Initial encounter. EXAM: CHEST  2 VIEW COMPARISON:  None. FINDINGS: The lungs are well-aerated. Peribronchial thickening is noted. Chronically increased interstitial markings are seen. There is no evidence of pleural effusion or pneumothorax. The heart is borderline normal in size. No acute osseous abnormalities are seen. Focal density at the left lung base may reflect a remote healed rib fracture. Diffuse calcification is seen along the distal descending thoracic aorta and proximal abdominal aorta. IMPRESSION: 1. Peribronchial thickening noted. Suspect chronically increased interstitial markings. 2. No definite displaced rib fracture seen. 3. Diffuse aortic atherosclerosis. Electronically Signed   By: Garald Balding M.D.   On: 05/18/2017 03:18   Dg Chest Port 1 View  Result Date: 05/19/2017 CLINICAL DATA:  Sepsis EXAM: PORTABLE CHEST 1 VIEW COMPARISON:  05/18/2017 FINDINGS: Mild peribronchial thickening. Heart is borderline in size. No confluent airspace opacities or effusions. No acute bony abnormality. IMPRESSION: Mild bronchitic changes. Electronically Signed   By: Rolm Baptise M.D.   On: 05/19/2017 09:48      Subjective: Pt is awake, alert, NAD, says she feels much better.    Discharge Exam: Vitals:   05/19/17 2248 05/20/17 0647  BP: 132/71 (!) 155/51  Pulse: 66 65  Resp: 18 18  Temp: 98.8 F (37.1 C) 98.6 F (37 C)  SpO2: 95% 97%   Vitals:   05/19/17 1500 05/19/17 2036 05/19/17 2248 05/20/17 0647  BP: (!) 168/48  132/71 (!) 155/51  Pulse: 65  66 65  Resp: 18  18 18   Temp: 99.2 F (37.3 C)  98.8 F (37.1 C) 98.6 F (37 C)  TempSrc: Oral  Oral Oral  SpO2: 96% 96% 95% 97%  Weight:      Height:        General: Pt is alert, awake, not in acute distress Cardiovascular: normal S1/S2 +, no rubs, no gallops Respiratory: CTA bilaterally, no wheezing, no rhonchi Abdominal: Soft, NT, ND, bowel sounds + Extremities: no edema, no cyanosis   The results of significant diagnostics from this hospitalization (including imaging, microbiology, ancillary and laboratory) are listed below for reference.     Microbiology: Recent Results (from the past 240 hour(s))  Culture, blood (routine x 2)     Status: None (Preliminary result)   Collection Time: 05/18/17  2:38 AM  Result Value Ref Range Status   Specimen Description BLOOD LEFT ARM  Final   Special Requests   Final    BOTTLES DRAWN AEROBIC AND ANAEROBIC Blood Culture adequate volume   Culture NO GROWTH 2 DAYS  Final   Report Status PENDING  Incomplete  Culture, blood (routine x 2)     Status: None (Preliminary result)   Collection Time: 05/18/17  2:46 AM  Result Value Ref Range Status   Specimen Description BLOOD LEFT HAND  Final   Special Requests   Final    BOTTLES DRAWN AEROBIC AND ANAEROBIC Blood Culture adequate volume   Culture NO GROWTH 2 DAYS  Final   Report Status PENDING  Incomplete  Urine culture     Status: Abnormal   Collection Time: 05/18/17  3:40 AM  Result Value Ref Range Status   Specimen Description URINE, CLEAN CATCH  Final   Special Requests NONE  Final   Culture MULTIPLE SPECIES PRESENT, SUGGEST RECOLLECTION (A)  Final   Report  Status 05/19/2017 FINAL  Final     Labs: BNP (last 3 results) No results for input(s): BNP in the last 8760 hours. Basic Metabolic Panel:  Recent Labs Lab 05/18/17 0238 05/19/17 0424  NA 137 141  K 3.5 4.4  CL 103 108  CO2 23 25  GLUCOSE 188* 109*  BUN 14 11  CREATININE 1.05* 0.82  CALCIUM 8.5* 8.3*  MG 1.7  --    Liver Function Tests:  Recent Labs Lab 05/18/17 0238  AST 24  ALT 16  ALKPHOS 68  BILITOT 0.7  PROT 6.8  ALBUMIN 3.9   No results for input(s): LIPASE,  AMYLASE in the last 168 hours. No results for input(s): AMMONIA in the last 168 hours. CBC:  Recent Labs Lab 05/18/17 0238 05/19/17 0424  WBC 11.6* 8.6  NEUTROABS 9.9* 5.8  HGB 10.8* 10.4*  HCT 34.1* 32.9*  MCV 85.5 86.6  PLT 170 168   Cardiac Enzymes: No results for input(s): CKTOTAL, CKMB, CKMBINDEX, TROPONINI in the last 168 hours. BNP: Invalid input(s): POCBNP CBG:  Recent Labs Lab 05/19/17 0744 05/19/17 1141 05/19/17 1722 05/19/17 2039 05/20/17 0719  GLUCAP 102* 90 130* 106* 115*   D-Dimer No results for input(s): DDIMER in the last 72 hours. Hgb A1c  Recent Labs  05/18/17 0238  HGBA1C 5.6   Lipid Profile No results for input(s): CHOL, HDL, LDLCALC, TRIG, CHOLHDL, LDLDIRECT in the last 72 hours. Thyroid function studies No results for input(s): TSH, T4TOTAL, T3FREE, THYROIDAB in the last 72 hours.  Invalid input(s): FREET3 Anemia work up No results for input(s): VITAMINB12, FOLATE, FERRITIN, TIBC, IRON, RETICCTPCT in the last 72 hours. Urinalysis    Component Value Date/Time   COLORURINE YELLOW 05/18/2017 0340   APPEARANCEUR CLOUDY (A) 05/18/2017 0340   LABSPEC 1.014 05/18/2017 0340   PHURINE 5.0 05/18/2017 0340   GLUCOSEU NEGATIVE 05/18/2017 0340   HGBUR SMALL (A) 05/18/2017 0340   BILIRUBINUR NEGATIVE 05/18/2017 0340   KETONESUR NEGATIVE 05/18/2017 0340   PROTEINUR NEGATIVE 05/18/2017 0340   NITRITE NEGATIVE 05/18/2017 0340   LEUKOCYTESUR LARGE (A) 05/18/2017 0340   Sepsis Labs Invalid input(s): PROCALCITONIN,  WBC,  LACTICIDVEN Microbiology Recent Results (from the past 240 hour(s))  Culture, blood (routine x 2)     Status: None (Preliminary result)   Collection Time: 05/18/17  2:38 AM  Result Value Ref Range Status   Specimen Description BLOOD LEFT ARM  Final   Special Requests   Final    BOTTLES DRAWN AEROBIC AND ANAEROBIC Blood Culture adequate volume   Culture NO GROWTH 2 DAYS  Final   Report Status PENDING  Incomplete  Culture,  blood (routine x 2)     Status: None (Preliminary result)   Collection Time: 05/18/17  2:46 AM  Result Value Ref Range Status   Specimen Description BLOOD LEFT HAND  Final   Special Requests   Final    BOTTLES DRAWN AEROBIC AND ANAEROBIC Blood Culture adequate volume   Culture NO GROWTH 2 DAYS  Final   Report Status PENDING  Incomplete  Urine culture     Status: Abnormal   Collection Time: 05/18/17  3:40 AM  Result Value Ref Range Status   Specimen Description URINE, CLEAN CATCH  Final   Special Requests NONE  Final   Culture MULTIPLE SPECIES PRESENT, SUGGEST RECOLLECTION (A)  Final   Report Status 05/19/2017 FINAL  Final   Time coordinating discharge: 33 mins  SIGNED:  Irwin Brakeman, MD  [facility] 05/20/2017, 9:55 AM  Pager [phone]  If 7PM-7AM, please contact night-coverage [email or web address] Password [email or web address]

## 2017-05-20 NOTE — Care Management Note (Signed)
Case Management Note  Patient Details  Name: Latoya Morris MRN: [id number] Date of Birth: 04-Aug-1934  Expected Discharge Date:  05/20/17               Expected Discharge Plan:  [place]  In-House Referral:  NA  Discharge planning Services  CM Consult  Post Acute Care Choice:  Home Health Choice offered to:  Patient  HH Arranged:  PT, RN [facility] Agency:  [facility]  Status of Service:  Completed, signed off  Additional Comments: DC home today. PT has changed rec to SNF. Pt does not have 2 MN and does not want to pay privately. Okay with going home with dtr, agreeable to [facility], has chosen [facility] from list of [facility] providers. Pt aware they have 48 hrs to make first visit. Santiago Glad, [facility] rep, aware of referral and will pull pt info form [facility]. Pt's dtr asking about aid services. CM explained they would have to pay OOP for that, they will reach out to companies that provide aid services to make a decision.   Sherald Barge, RN 05/20/2017, 10:12 AM

## 2017-05-20 NOTE — Progress Notes (Signed)
Physical Therapy Treatment Patient Details Name: Latoya Morris MRN: [id number] DOB: Apr 25, 1934 Today's Date: 05/20/2017    History of Present Illness Latoya Morris is a 83 y.o. female with medical history significant of anxiety, constipation, depression, hyperlipidemia, hypertension, history of previous stroke who was brought to the emergency department via EMS after falling from her side of the bed around 0030 to 0045, spilling a glass of water, followed by weakness, transient dyspnea, confusion and inability to get up on her own. Her relatives state that she kept saying "I am so tired" or "I am so weak" multiple times. Her daughter states that she was confused earlier in the evening and complained of having chills earlier in the day. She has been having a mild nonproductive cough recently with mild dyspnea. One of her relatives had an URI about 11 days ago. However, no other sick contacts to their knowledge. She denies headache, fever, sore throat, body aches, chest pain, abdominal pain, nausea, emesis, diarrhea, melena or hematochezia. She denies dysuria, frequency or gross hematuria. No polyuria, no polydipsia or blurred vision. She occasionally gets pruritus and is taking hydroxyzine for this.    PT Comments    Patient demonstrates labored movement with much time to sit up at bedside due to generalized weakness, had to frequently use side rails and hand held assist to maintain balance during gait training and tolerated sitting up in chair after therapy.  Patient will benefit from continued physical therapy in hospital and recommended venue below to increase strength, balance, endurance for safe ADLs and gait.    Follow Up Recommendations  SNF;Supervision/Assistance - 24 hour     Equipment Recommendations  None recommended by PT    Recommendations for Other Services       Precautions / Restrictions Precautions Precautions: Fall Restrictions Weight Bearing Restrictions: No     Mobility  Bed Mobility Overal bed mobility: Needs Assistance Bed Mobility: Supine to Sit;Sit to Supine     Supine to sit: Min guard Sit to supine: Min guard   General bed mobility comments: requires much time with labored movement  Transfers Overall transfer level: Needs assistance Equipment used: Straight cane Transfers: Sit to/from [employer] Transfers Sit to Stand: Min guard Stand pivot transfers: Min guard          Ambulation/Gait Ambulation/Gait assistance: Min guard Ambulation Distance (Feet): 35 Feet Assistive device: Straight cane Gait Pattern/deviations: Decreased step length - right;Decreased step length - left;Decreased stride length   Gait velocity interpretation: Below normal speed for age/gender General Gait Details: Patient demonstrates slow slightly labored cadence with frequent use of siderails for balancing, limited secondary to SOB   Stairs            Wheelchair Mobility    Modified Rankin (Stroke Patients Only)       Balance Overall balance assessment: Needs assistance Sitting-balance support: Feet supported;No upper extremity supported Sitting balance-Leahy Scale: Good     Standing balance support: Single extremity supported;During functional activity Standing balance-Leahy Scale: Fair                              Cognition Arousal/Alertness: Awake/alert Behavior During Therapy: WFL for tasks assessed/performed Overall Cognitive Status: Within Functional Limits for tasks assessed  Exercises General Exercises - Lower Extremity Ankle Circles/Pumps: Seated;AROM;Both;Strengthening;10 reps Long Arc Quad: Seated;AROM;Strengthening;Both;10 reps Hip Flexion/Marching: Seated;AROM;Strengthening;Both;10 reps    General Comments        Pertinent Vitals/Pain Pain Assessment: 0-10 Pain Score: 6  Pain Location: bilateral hands possibly due to arthritis per  patient Pain Descriptors / Indicators: Aching Pain Intervention(s): Limited activity within patient's tolerance;Monitored during session    Home Living                      Prior Function            PT Goals (current goals can now be found in the care plan section) Acute Rehab PT Goals Patient Stated Goal: return home after rehab PT Goal Formulation: With patient/family Time For Goal Achievement: 05/25/17 Potential to Achieve Goals: Good Progress towards PT goals: Progressing toward goals    Frequency    Min 3X/week      PT Plan Current plan remains appropriate    Co-evaluation              AM-PAC PT "6 Clicks" Daily Activity  Outcome Measure  Difficulty turning over in bed (including adjusting bedclothes, sheets and blankets)?: A Little Difficulty moving from lying on back to sitting on the side of the bed? : A Little Difficulty sitting down on and standing up from a chair with arms (e.g., wheelchair, bedside commode, etc,.)?: A Little Help needed moving to and from a bed to chair (including a wheelchair)?: A Little Help needed walking in hospital room?: A Little Help needed climbing 3-5 steps with a railing? : A Lot 6 Click Score: 17    End of Session Equipment Utilized During Treatment: Gait belt Activity Tolerance: Patient limited by fatigue (Patient limited secondary to c/o SOB) Patient left: in chair;with call bell/phone within reach Nurse Communication: Mobility status PT Visit Diagnosis: Unsteadiness on feet (R26.81);Other abnormalities of gait and mobility (R26.89);Muscle weakness (generalized) (M62.81)     Time: 6811-5726 PT Time Calculation (min) (ACUTE ONLY): 26 min  Charges:  $Therapeutic Activity: 23-37 mins                    G Codes:  Functional Assessment Tool Used: AM-PAC 6 Clicks Basic Mobility Functional Limitation: Mobility: Walking and moving around Mobility: Walking and Moving Around Current Status (O0355): At least 40  percent but less than 60 percent impaired, limited or restricted Mobility: Walking and Moving Around Goal Status [phone]): At least 40 percent but less than 60 percent impaired, limited or restricted Mobility: Walking and Moving Around Discharge Status [phone]): At least 40 percent but less than 60 percent impaired, limited or restricted    9:35 AM, 05/20/17 Lonell Grandchild, MPT Physical Therapist with [facility] [phone] office [phone] mobile phone

## 2017-05-20 NOTE — Care Management (Signed)
Pt ordered neb treatments, has no neb machine. Machine orderd and referral sent to [place], [facility] rep, who will pull order from epic and deliver neb machine prior to DC.

## 2017-05-23 LAB — CULTURE, BLOOD (ROUTINE X 2)
CULTURE: NO GROWTH
Culture: NO GROWTH
Special Requests: ADEQUATE
Special Requests: ADEQUATE

## 2018-09-26 ENCOUNTER — Encounter (HOSPITAL_COMMUNITY): Payer: Self-pay

## 2018-09-26 ENCOUNTER — Observation Stay (HOSPITAL_COMMUNITY)
Admission: EM | Admit: 2018-09-26 | Discharge: 2018-09-27 | Disposition: A | Discharge status: Home / Self Care | Payer: Medicare Other | Attending: Internal Medicine | Admitting: Internal Medicine

## 2018-09-26 ENCOUNTER — Other Ambulatory Visit: Payer: Self-pay

## 2018-09-26 ENCOUNTER — Emergency Department (HOSPITAL_COMMUNITY): Payer: Medicare Other

## 2018-09-26 DIAGNOSIS — G309 Alzheimer's disease, unspecified: Secondary | ICD-10-CM | POA: Insufficient documentation

## 2018-09-26 DIAGNOSIS — Z79899 Other long term (current) drug therapy: Secondary | ICD-10-CM | POA: Diagnosis not present

## 2018-09-26 DIAGNOSIS — F329 Major depressive disorder, single episode, unspecified: Secondary | ICD-10-CM | POA: Diagnosis not present

## 2018-09-26 DIAGNOSIS — Z8673 Personal history of transient ischemic attack (TIA), and cerebral infarction without residual deficits: Secondary | ICD-10-CM | POA: Diagnosis not present

## 2018-09-26 DIAGNOSIS — K8689 Other specified diseases of pancreas: Secondary | ICD-10-CM | POA: Diagnosis not present

## 2018-09-26 DIAGNOSIS — F0390 Unspecified dementia without behavioral disturbance: Secondary | ICD-10-CM | POA: Diagnosis not present

## 2018-09-26 DIAGNOSIS — K862 Cyst of pancreas: Secondary | ICD-10-CM | POA: Diagnosis not present

## 2018-09-26 DIAGNOSIS — E039 Hypothyroidism, unspecified: Secondary | ICD-10-CM | POA: Insufficient documentation

## 2018-09-26 DIAGNOSIS — Z7902 Long term (current) use of antithrombotics/antiplatelets: Secondary | ICD-10-CM | POA: Diagnosis not present

## 2018-09-26 DIAGNOSIS — Z87891 Personal history of nicotine dependence: Secondary | ICD-10-CM | POA: Insufficient documentation

## 2018-09-26 DIAGNOSIS — R1010 Upper abdominal pain, unspecified: Secondary | ICD-10-CM | POA: Diagnosis present

## 2018-09-26 DIAGNOSIS — Z7982 Long term (current) use of aspirin: Secondary | ICD-10-CM | POA: Diagnosis not present

## 2018-09-26 DIAGNOSIS — N390 Urinary tract infection, site not specified: Secondary | ICD-10-CM | POA: Diagnosis not present

## 2018-09-26 DIAGNOSIS — I10 Essential (primary) hypertension: Secondary | ICD-10-CM | POA: Diagnosis not present

## 2018-09-26 DIAGNOSIS — F32A Depression, unspecified: Secondary | ICD-10-CM | POA: Diagnosis present

## 2018-09-26 HISTORY — DX: Dementia in other diseases classified elsewhere, unspecified severity, without behavioral disturbance, psychotic disturbance, mood disturbance, and anxiety: F02.80

## 2018-09-26 HISTORY — DX: Alzheimer's disease, unspecified: G30.9

## 2018-09-26 LAB — URINALYSIS, ROUTINE W REFLEX MICROSCOPIC
Bilirubin Urine: NEGATIVE
Glucose, UA: NEGATIVE mg/dL
Ketones, ur: NEGATIVE mg/dL
Nitrite: NEGATIVE
Protein, ur: 30 mg/dL — AB
SPECIFIC GRAVITY, URINE: 1.016 (ref 1.005–1.030)
pH: 5 (ref 5.0–8.0)

## 2018-09-26 LAB — CBC
HCT: 40.9 % (ref 36.0–46.0)
Hemoglobin: 13 g/dL (ref 12.0–15.0)
MCH: 31 pg (ref 26.0–34.0)
MCHC: 31.8 g/dL (ref 30.0–36.0)
MCV: 97.6 fL (ref 80.0–100.0)
PLATELETS: 165 10*3/uL (ref 150–400)
RBC: 4.19 MIL/uL (ref 3.87–5.11)
RDW: 12.2 % (ref 11.5–15.5)
WBC: 7.6 10*3/uL (ref 4.0–10.5)
nRBC: 0 % (ref 0.0–0.2)

## 2018-09-26 LAB — COMPREHENSIVE METABOLIC PANEL
ALT: 31 U/L (ref 0–44)
AST: 32 U/L (ref 15–41)
Albumin: 3.9 g/dL (ref 3.5–5.0)
Alkaline Phosphatase: 78 U/L (ref 38–126)
Anion gap: 8 (ref 5–15)
BUN: 11 mg/dL (ref 8–23)
CO2: 25 mmol/L (ref 22–32)
Calcium: 8.7 mg/dL — ABNORMAL LOW (ref 8.9–10.3)
Chloride: 105 mmol/L (ref 98–111)
Creatinine, Ser: 0.85 mg/dL (ref 0.44–1.00)
GFR calc Af Amer: >60 mL/min (ref >60)
GFR calc non Af Amer: >60 mL/min (ref >60)
Glucose, Bld: 183 mg/dL — ABNORMAL HIGH (ref 70–99)
Potassium: 3.9 mmol/L (ref 3.5–5.1)
SODIUM: 138 mmol/L (ref 135–145)
Total Bilirubin: 0.7 mg/dL (ref 0.3–1.2)
Total Protein: 7.1 g/dL (ref 6.5–8.1)

## 2018-09-26 LAB — LIPASE, BLOOD: Lipase: 19 U/L (ref 11–51)

## 2018-09-26 MED ORDER — IOHEXOL 300 MG/ML  SOLN
100.0000 mL | Freq: Once | INTRAMUSCULAR | Status: AC | PRN
  Administered 2018-09-26: 100 mL via INTRAVENOUS

## 2018-09-26 MED ORDER — FLUOXETINE HCL 20 MG PO CAPS
40.0000 mg | ORAL_CAPSULE | Freq: Every morning | ORAL | Status: DC
  Administered 2018-09-27: 40 mg via ORAL
  Filled 2018-09-26: qty 2

## 2018-09-26 MED ORDER — HYDROCODONE-ACETAMINOPHEN 5-325 MG PO TABS
1.0000 | ORAL_TABLET | ORAL | Status: DC | PRN
  Administered 2018-09-27: 1 via ORAL
  Filled 2018-09-26: qty 1

## 2018-09-26 MED ORDER — ACETAMINOPHEN 650 MG RE SUPP: 650.0000 mg | Freq: Four times a day (QID) | RECTAL | Status: DC | PRN

## 2018-09-26 MED ORDER — AMLODIPINE BESYLATE 5 MG PO TABS
5.0000 mg | ORAL_TABLET | Freq: Every day | ORAL | Status: DC
  Administered 2018-09-27: 5 mg via ORAL
  Filled 2018-09-26: qty 1

## 2018-09-26 MED ORDER — RISPERIDONE 0.5 MG PO TABS
0.2500 mg | ORAL_TABLET | Freq: Every day | ORAL | Status: DC
  Administered 2018-09-26: 0.25 mg via ORAL
  Filled 2018-09-26 (×3): qty 1

## 2018-09-26 MED ORDER — ONDANSETRON HCL 4 MG/2ML IJ SOLN: 4.0000 mg | Freq: Four times a day (QID) | INTRAMUSCULAR | Status: DC | PRN

## 2018-09-26 MED ORDER — ACETAMINOPHEN 325 MG PO TABS: 650.0000 mg | ORAL_TABLET | Freq: Four times a day (QID) | ORAL | Status: DC | PRN

## 2018-09-26 MED ORDER — SENNOSIDES-DOCUSATE SODIUM 8.6-50 MG PO TABS
1.0000 | ORAL_TABLET | Freq: Every evening | ORAL | Status: DC | PRN
  Filled 2018-09-26: qty 1

## 2018-09-26 MED ORDER — TRAZODONE HCL 50 MG PO TABS
250.0000 mg | ORAL_TABLET | Freq: Every day | ORAL | Status: DC
  Administered 2018-09-26: 250 mg via ORAL
  Filled 2018-09-26: qty 5

## 2018-09-26 MED ORDER — HYDROCODONE-ACETAMINOPHEN 5-325 MG PO TABS
1.0000 | ORAL_TABLET | Freq: Once | ORAL | Status: AC
  Administered 2018-09-26: 1 via ORAL
  Filled 2018-09-26: qty 1

## 2018-09-26 MED ORDER — RISPERIDONE 0.5 MG PO TABS
ORAL_TABLET | ORAL | Status: AC
  Filled 2018-09-26: qty 1

## 2018-09-26 MED ORDER — BISACODYL 5 MG PO TBEC: 5.0000 mg | DELAYED_RELEASE_TABLET | Freq: Every day | ORAL | Status: DC | PRN

## 2018-09-26 MED ORDER — LEVOTHYROXINE SODIUM 25 MCG PO TABS
25.0000 ug | ORAL_TABLET | Freq: Every day | ORAL | Status: DC
  Filled 2018-09-26: qty 1

## 2018-09-26 MED ORDER — HYDRALAZINE HCL 20 MG/ML IJ SOLN: 10.0000 mg | INTRAMUSCULAR | Status: DC | PRN

## 2018-09-26 MED ORDER — GABAPENTIN 300 MG PO CAPS
300.0000 mg | ORAL_CAPSULE | Freq: Two times a day (BID) | ORAL | Status: DC
  Administered 2018-09-26 – 2018-09-27 (×2): 300 mg via ORAL
  Filled 2018-09-26 (×2): qty 1

## 2018-09-26 MED ORDER — ALBUTEROL SULFATE (2.5 MG/3ML) 0.083% IN NEBU: 2.5000 mg | INHALATION_SOLUTION | Freq: Four times a day (QID) | RESPIRATORY_TRACT | Status: DC | PRN

## 2018-09-26 MED ORDER — SODIUM CHLORIDE 0.9 % IV SOLN
INTRAVENOUS | Status: AC
  Administered 2018-09-26: 23:00:00 via INTRAVENOUS

## 2018-09-26 MED ORDER — TRAMADOL HCL 50 MG PO TABS: 50.0000 mg | ORAL_TABLET | Freq: Four times a day (QID) | ORAL | Status: DC | PRN

## 2018-09-26 MED ORDER — CIPROFLOXACIN HCL 250 MG PO TABS
250.0000 mg | ORAL_TABLET | Freq: Two times a day (BID) | ORAL | Status: DC
  Administered 2018-09-26 – 2018-09-27 (×2): 250 mg via ORAL
  Filled 2018-09-26 (×2): qty 1

## 2018-09-26 MED ORDER — ONDANSETRON HCL 4 MG PO TABS: 4.0000 mg | ORAL_TABLET | Freq: Four times a day (QID) | ORAL | Status: DC | PRN

## 2018-09-26 MED ORDER — ATORVASTATIN CALCIUM 40 MG PO TABS
40.0000 mg | ORAL_TABLET | Freq: Every day | ORAL | Status: DC
  Administered 2018-09-26: 40 mg via ORAL
  Filled 2018-09-26 (×2): qty 1

## 2018-09-26 NOTE — ED Provider Notes (Signed)
[facility] EMERGENCY DEPARTMENT Provider Note   CSN: [id number] Arrival date & time: 09/26/18  1337    History   Chief Complaint Chief Complaint  Patient presents with  . Abdominal Pain    HPI Latoya Morris is a 85 y.o. female.     Patient complains of upper abdominal pain.  This is been going on for 3 weeks and seems to be getting worse she is also having some dysuria  The history is provided by the patient. No language interpreter was used.  Abdominal Pain  Pain location:  Epigastric Pain quality: aching   Pain radiates to:  Does not radiate Pain severity:  Moderate Onset quality:  Sudden Timing:  Constant Progression:  Worsening Chronicity:  New Context: not alcohol use   Relieved by:  Nothing Worsened by:  Nothing Ineffective treatments:  None tried Associated symptoms: no chest pain, no cough, no diarrhea, no fatigue and no hematuria     Past Medical History:  Diagnosis Date  . Alzheimer's dementia ([place])   . Anxiety   . Constipation   . Depression   . Hyperlipidemia   . Hypertension   . Stroke [facility])    Doesn't remember year of stroke other than it not being recent    Patient Active Problem List   Diagnosis Date Noted  . Pancreatic mass 09/26/2018  . Urinary tract infection 05/18/2017  . Altered mental status 05/18/2017  . Hyperlipidemia 05/18/2017  . History of CVA (cerebrovascular accident) 05/18/2017  . Hyperglycemia 05/18/2017  . SINUSITIS- ACUTE-NOS 07/24/2008  . ALLERGIC RHINITIS 07/24/2008  . ACTINIC KERATOSIS 02/23/2008  . GASTROENTERITIS 09/08/2007  . CONTUSION, LOWER LEG 03/08/2007  . LUMBOSACRAL STRAIN 03/01/2007  . Depression 2020-01-1807  . Essential hypertension 2020-01-1807  . DIVERTICULOSIS, COLON 2020-01-1807  . Constipation 2020-01-1807  . SEBORRHEA 2020-01-1807  . OSTEOARTHRITIS 2020-01-1807  . OSTEOPOROSIS 2020-01-1807    Past Surgical History:  Procedure Laterality Date  . TUBAL LIGATION       OB History   No obstetric  history on file.      Home Medications    Prior to Admission medications   Medication Sig Start Date End Date Taking? Authorizing Provider  albuterol (PROVENTIL) (2.5 MG/3ML) 0.083% nebulizer solution Take 3 mLs (2.5 mg total) by nebulization every 6 (six) hours as needed for wheezing or shortness of breath. 05/20/17 09/26/18 Yes Johnson, Clanford L, MD  amLODipine (NORVASC) 5 MG tablet Take 5 mg by mouth daily.   Yes [provider]  aspirin EC 325 MG tablet Take 325 mg by mouth daily.   Yes [provider]  atorvastatin (LIPITOR) 40 MG tablet Take 40 mg by mouth every evening.    Yes [provider]  Biotin 10000 MCG TABS Take 1 tablet by mouth daily.   Yes [provider]  bisacodyl (DULCOLAX) 5 MG EC tablet Take 5-15 mg by mouth daily as needed for moderate constipation.    Yes [provider]  clopidogrel (PLAVIX) 75 MG tablet Take 75 mg by mouth daily.   Yes [provider]  FLUoxetine (PROZAC) 20 MG capsule Take 40 mg by mouth every morning.    Yes [provider]  gabapentin (NEURONTIN) 300 MG capsule Take 300 mg by mouth 2 (two) times daily.    Yes [provider]  Hypromellose (GENTEAL MILD) 0.2 % SOLN Apply 1-2 drops to eye daily as needed (for dry eye relief).   Yes [provider]  levothyroxine (SYNTHROID, LEVOTHROID) 25 MCG tablet  Take 25 mcg by mouth every morning.  06/29/18  Yes [provider]  pyrithione zinc (HEAD AND SHOULDERS) 1 % shampoo Apply topically daily as needed for itching.   Yes [provider]  risperiDONE (RISPERDAL) 0.25 MG tablet Take 0.25 mg by mouth at bedtime. 09/23/18  Yes [provider]  traMADol (ULTRAM) 50 MG tablet Take 50 mg by mouth every 6 (six) hours as needed. For pain 12/22/15  Yes [provider]  traZODone (DESYREL) 50 MG tablet Take 250 mg by mouth at bedtime.    Yes [provider]    Family History Family History    Problem Relation Age of Onset  . Heart disease Mother   . Stomach cancer Sister   . Throat cancer Brother   . Stroke Sister   . Dementia Sister   . Dementia Sister     Social History Social History   Tobacco Use  . Smoking status: Former Smoker    Packs/day: 1.50    Years: 25.00    Pack years: 37.50    Last attempt to quit: 09/21/2013    Years since quitting: 5.0  . Smokeless tobacco: Never Used  Substance Use Topics  . Alcohol use: Not on file  . Drug use: Not on file     Allergies   Penicillins   Review of Systems Review of Systems  Constitutional: Negative for appetite change and fatigue.  HENT: Negative for congestion, ear discharge and sinus pressure.   Eyes: Negative for discharge.  Respiratory: Negative for cough.   Cardiovascular: Negative for chest pain.  Gastrointestinal: Positive for abdominal pain. Negative for diarrhea.  Genitourinary: Negative for frequency and hematuria.  Musculoskeletal: Negative for back pain.  Skin: Negative for rash.  Neurological: Negative for seizures and headaches.  Psychiatric/Behavioral: Negative for hallucinations.     Physical Exam Updated Vital Signs BP (!) 197/76 (BP Location: Left Arm) Comment: rn [place] notified  Pulse 96   Temp 98.7 F (37.1 C) (Oral)   Resp 16   Ht 5\' 5"  (1.651 m)   Wt 86.2 kg   SpO2 96%   BMI 31.62 kg/m   Physical Exam Vitals signs and nursing note reviewed.  Constitutional:      Appearance: She is well-developed.  HENT:     Head: Normocephalic.     Nose: Nose normal.  Eyes:     General: No scleral icterus.    Conjunctiva/sclera: Conjunctivae normal.  Neck:     Musculoskeletal: Neck supple.     Thyroid: No thyromegaly.  Cardiovascular:     Rate and Rhythm: Normal rate and regular rhythm.     Heart sounds: No murmur. No friction rub. No gallop.   Pulmonary:     Breath sounds: No stridor. No wheezing or rales.  Chest:     Chest wall: No tenderness.  Abdominal:     General:  There is no distension.     Tenderness: There is no abdominal tenderness. There is no rebound.  Musculoskeletal: Normal range of motion.  Lymphadenopathy:     Cervical: No cervical adenopathy.  Skin:    Findings: No erythema or rash.  Neurological:     Mental Status: She is oriented to person, place, and time.     Motor: No abnormal muscle tone.     Coordination: Coordination normal.  Psychiatric:        Behavior: Behavior normal.      ED Treatments / Results  Labs (all labs ordered are listed, but  only abnormal results are displayed) Labs Reviewed  COMPREHENSIVE METABOLIC PANEL - Abnormal; Notable for the following components:      Result Value   Glucose, Bld 183 (*)    Calcium 8.7 (*)    All other components within normal limits  URINALYSIS, ROUTINE W REFLEX MICROSCOPIC - Abnormal; Notable for the following components:   APPearance HAZY (*)    Hgb urine dipstick MODERATE (*)    Protein, ur 30 (*)    Leukocytes,Ua TRACE (*)    Bacteria, UA RARE (*)    All other components within normal limits  URINE CULTURE  LIPASE, BLOOD  CBC    EKG None  Radiology Ct Abdomen Pelvis W Contrast  Result Date: 09/26/2018 CLINICAL DATA:  85 year old female with abdominal pain and distension for several weeks. EXAM: CT ABDOMEN AND PELVIS WITH CONTRAST TECHNIQUE: Multidetector CT imaging of the abdomen and pelvis was performed using the standard protocol following bolus administration of intravenous contrast. CONTRAST:  158mL OMNIPAQUE IOHEXOL 300 MG/ML  SOLN COMPARISON:  CT Abdomen 02/12/2010. FINDINGS: Lower chest: Minimal lung base scarring or atelectasis. No pericardial or pleural effusion. Hepatobiliary: There are 2 subtle hypodense areas in the liver which are new since 2011. There is a 12 millimeter area at the liver dome on series 2, image 12, and a smaller 8-10 millimeter area in the right hepatic lobe on image 28. Negative gallbladder.  No biliary ductal enlargement. Pancreas:  Spiculated and hypoenhancing mass in the body of the pancreas encompassing 22 x 23 x 31 millimeters with invasion and occlusion of the splenic vein. Partial encasement of the proximal SMV which remains patent. Regional mesenteric nodularity which may be small metastatic lymph nodes (coronal image 37, 33). No dilatation of the main pancreatic duct. Spleen: Splenomegaly and trace perisplenic free fluid. Adrenals/Urinary Tract: Adrenal glands remain normal. Chronic left renal atrophy. The right kidney and right ureter are within normal limits. Stomach/Bowel: Negative rectum. Diverticulosis of the sigmoid colon, no definite active inflammation. The more proximal large bowel is within normal limits. No dilated small bowel. Stomach is remarkable for a small hiatal hernia and perigastric varices. Duodenum remains within normal limits. No free air. Vascular/Lymphatic: Aortoiliac calcified atherosclerosis. Major arterial structures are patent. The splenic vein is being thrombosed by the pancreatic mass (series 2, image 23). There are associated perigastric varices. The main portal vein and SMV remain patent. Peripancreatic nodularity suspicious for small metastatic nodes. Otherwise no overt lymphadenopathy. Reproductive: Diminutive, negative. Other: No pelvic free fluid. Musculoskeletal: Advanced disc degeneration throughout the spine. Degenerative lumbar spondylolisthesis with advanced posterior element degeneration. No acute osseous abnormality identified. IMPRESSION: 1. Spiculated and hypoenhancing pancreatic body tumor 3.1 cm suspicious for Pancreatic Adenocarcinoma. This has occluded the splenic vein (see #2) and partially encases the proximal SMV. There are two small hypoenhancing liver lesions which are new since 2011 and suspicious for early hepatic metastases. 2. Splenomegaly with perigastric varices. 3. Chronic left renal atrophy. Aortic Atherosclerosis (ICD10-I70.0). Electronically Signed   By: Genevie Ann M.D.   On:  09/26/2018 19:34    Procedures Procedures (including critical care time)  Medications Ordered in ED Medications  iohexol (OMNIPAQUE) 300 MG/ML solution 100 mL (100 mLs Intravenous Contrast Given 09/26/18 1855)  HYDROcodone-acetaminophen (NORCO/VICODIN) 5-325 MG per tablet 1 tablet (1 tablet Oral Given 09/26/18 1936)     Initial Impression / Assessment and Plan / ED Course  I have reviewed the triage vital signs and the nursing notes.  Pertinent labs & imaging results that were  available during my care of the patient were reviewed by me and considered in my medical decision making (see chart for details).        CT scan shows new pancreatic mass.  Patient also has a urinary tract infection.  She will be admitted to medicine  Final Clinical Impressions(s) / ED Diagnoses   Final diagnoses:  Pain of upper abdomen    ED Discharge Orders    None       Milton Ferguson, MD 09/26/18 2003

## 2018-09-26 NOTE — H&P (Addendum)
History and Physical    KACELYN ROWZEE QMV:[id number] DOB: [birth year] DOA: 09/26/2018  PCP: Kirk Ruths, MD   Patient coming from: Home   Chief Complaint: Upper abdominal pain, fatigue, dysuria  HPI: Latoya Morris is a 85 y.o. female with medical history significant for hypothyroidism, hypertension, recurrent UTI, Alzheimer dementia, history of CVA, and depression with anxiety, now presenting to the emergency department for evaluation of upper abdominal pain and fatigue.  Patient began to complain of some upper abdominal discomfort approximately 3 weeks ago, but this worsened significantly over the past 3 days.  She describes this as a dull aching pain localized to the upper abdomen in the center.  This has been associated with fatigue, but no nausea, vomiting, diarrhea, weight loss, night sweats, fevers, or chills.  Patient also reports some dysuria for the past few days without any flank pain or fevers.  No gross hematuria.  ED Course: Upon arrival to the ED, patient is found to be afebrile, saturating well on room air, hypertensive to 197/76, and vitals otherwise normal.  Chemistry panel is notable for glucose of 183 and CBC is unremarkable.  CT of the abdomen and pelvis reveals a spiculated and hypoenhancing pancreatic body tumor suspicious for pancreatic adenocarcinoma with occlusion of the splenic vein, partial encasement of proximal SMV, and 2 small liver lesions suspicious for early hepatic metastases.  Patient was given Norco in the ED and hospitalist asked to admit to expedite work-up of the pancreatic mass.  Review of Systems:  All other systems reviewed and apart from HPI, are negative.  Past Medical History:  Diagnosis Date  . Alzheimer's dementia ([place])   . Anxiety   . Constipation   . Depression   . Hyperlipidemia   . Hypertension   . Stroke [facility])    Doesn't remember year of stroke other than it not being recent    Past Surgical History:  Procedure Laterality  Date  . TUBAL LIGATION       reports that she quit smoking about 5 years ago. She has a 37.50 pack-year smoking history. She has never used smokeless tobacco. No history on file for alcohol and drug.  Allergies  Allergen Reactions  . Penicillins     Did it involve swelling of the face/tongue/throat, SOB, or low BP? Unknown Did it involve sudden or severe rash/hives, skin peeling, or any reaction on the inside of your mouth or nose? Unknown Did you need to seek medical attention at a hospital or doctor's office? Unknown  When did it last happen? If all above answers are "NO", may proceed with cephalosporin use.     Family History  Problem Relation Age of Onset  . Heart disease Mother   . Stomach cancer Sister   . Throat cancer Brother   . Stroke Sister   . Dementia Sister   . Dementia Sister      Prior to Admission medications   Medication Sig Start Date End Date Taking? Authorizing Provider  albuterol (PROVENTIL) (2.5 MG/3ML) 0.083% nebulizer solution Take 3 mLs (2.5 mg total) by nebulization every 6 (six) hours as needed for wheezing or shortness of breath. 05/20/17 09/26/18 Yes Johnson, Clanford L, MD  amLODipine (NORVASC) 5 MG tablet Take 5 mg by mouth daily.   Yes [provider]  aspirin EC 325 MG tablet Take 325 mg by mouth daily.   Yes [provider]  atorvastatin (LIPITOR) 40 MG tablet Take 40 mg by mouth every evening.  Yes [provider]  Biotin 10000 MCG TABS Take 1 tablet by mouth daily.   Yes [provider]  bisacodyl (DULCOLAX) 5 MG EC tablet Take 5-15 mg by mouth daily as needed for moderate constipation.    Yes [provider]  clopidogrel (PLAVIX) 75 MG tablet Take 75 mg by mouth daily.   Yes [provider]  FLUoxetine (PROZAC) 20 MG capsule Take 40 mg by mouth every morning.    Yes [provider]  gabapentin (NEURONTIN) 300 MG capsule Take 300 mg by mouth 2 (two) times daily.    Yes  [provider]  Hypromellose (GENTEAL MILD) 0.2 % SOLN Apply 1-2 drops to eye daily as needed (for dry eye relief).   Yes [provider]  levothyroxine (SYNTHROID, LEVOTHROID) 25 MCG tablet Take 25 mcg by mouth every morning.  06/29/18  Yes [provider]  pyrithione zinc (HEAD AND SHOULDERS) 1 % shampoo Apply topically daily as needed for itching.   Yes [provider]  risperiDONE (RISPERDAL) 0.25 MG tablet Take 0.25 mg by mouth at bedtime. 09/23/18  Yes [provider]  traMADol (ULTRAM) 50 MG tablet Take 50 mg by mouth every 6 (six) hours as needed. For pain 12/22/15  Yes [provider]  traZODone (DESYREL) 50 MG tablet Take 250 mg by mouth at bedtime.    Yes [provider]    Physical Exam: Vitals:   09/26/18 1405 09/26/18 1406 09/26/18 1938  BP: (!) 121/98  (!) 197/76  Pulse: 93  96  Resp: 15  16  Temp: 98.8 F (37.1 C)  98.7 F (37.1 C)  TempSrc: Oral  Oral  SpO2: 97%  96%  Weight:  86.2 kg   Height:  5\' 5"  (1.651 m)     Constitutional: NAD, calm  Eyes: PERTLA, lids and conjunctivae normal ENMT: Mucous membranes are moist. Posterior pharynx clear of any exudate or lesions.   Neck: normal, supple, no masses, no thyromegaly Respiratory: clear to auscultation bilaterally, no wheezing, no crackles. Normal respiratory effort.   Cardiovascular: S1 & S2 heard, regular rate and rhythm. No extremity edema.   Abdomen: No distension, no tenderness, soft. Bowel sounds active.  Musculoskeletal: no clubbing / cyanosis. No joint deformity upper and lower extremities.    Skin: no significant rashes, lesions, ulcers. Warm, dry, well-perfused. Neurologic: No gross facial asymmetry. Mild dysarthria. Sensation intact. Moving all extremities.  Psychiatric:  Alert and oriented to person, place, and situation. Pleasant and cooperative.    Labs on Admission: I have personally reviewed following labs and imaging  studies  CBC: Recent Labs  Lab 09/26/18 1442  WBC 7.6  HGB 13.0  HCT 40.9  MCV 97.6  PLT 856   Basic Metabolic Panel: Recent Labs  Lab 09/26/18 1442  NA 138  K 3.9  CL 105  CO2 25  GLUCOSE 183*  BUN 11  CREATININE 0.85  CALCIUM 8.7*   GFR: Estimated Creatinine Clearance: 55.3 mL/min (by C-G formula based on SCr of 0.85 mg/dL). Liver Function Tests: Recent Labs  Lab 09/26/18 1442  AST 32  ALT 31  ALKPHOS 78  BILITOT 0.7  PROT 7.1  ALBUMIN 3.9   Recent Labs  Lab 09/26/18 1442  LIPASE 19   No results for input(s): AMMONIA in the last 168 hours. Coagulation Profile: No results for input(s): INR, PROTIME in the last 168 hours. Cardiac Enzymes: No results for input(s): CKTOTAL, CKMB, CKMBINDEX, TROPONINI in the last 168 hours. BNP (last 3  results) No results for input(s): PROBNP in the last 8760 hours. HbA1C: No results for input(s): HGBA1C in the last 72 hours. CBG: No results for input(s): GLUCAP in the last 168 hours. Lipid Profile: No results for input(s): CHOL, HDL, LDLCALC, TRIG, CHOLHDL, LDLDIRECT in the last 72 hours. Thyroid Function Tests: No results for input(s): TSH, T4TOTAL, FREET4, T3FREE, THYROIDAB in the last 72 hours. Anemia Panel: No results for input(s): VITAMINB12, FOLATE, FERRITIN, TIBC, IRON, RETICCTPCT in the last 72 hours. Urine analysis:    Component Value Date/Time   COLORURINE YELLOW 09/26/2018 1409   APPEARANCEUR HAZY (A) 09/26/2018 1409   LABSPEC 1.016 09/26/2018 1409   PHURINE 5.0 09/26/2018 1409   GLUCOSEU NEGATIVE 09/26/2018 1409   HGBUR MODERATE (A) 09/26/2018 1409   BILIRUBINUR NEGATIVE 09/26/2018 1409   KETONESUR NEGATIVE 09/26/2018 1409   PROTEINUR 30 (A) 09/26/2018 1409   NITRITE NEGATIVE 09/26/2018 1409   LEUKOCYTESUR TRACE (A) 09/26/2018 1409   Sepsis Labs: @LABRCNTIP (procalcitonin:4,lacticidven:4) )No results found for this or any previous visit (from the past 240 hour(s)).   Radiological Exams on  Admission: Ct Abdomen Pelvis W Contrast  Result Date: 09/26/2018 CLINICAL DATA:  85 year old female with abdominal pain and distension for several weeks. EXAM: CT ABDOMEN AND PELVIS WITH CONTRAST TECHNIQUE: Multidetector CT imaging of the abdomen and pelvis was performed using the standard protocol following bolus administration of intravenous contrast. CONTRAST:  125mL OMNIPAQUE IOHEXOL 300 MG/ML  SOLN COMPARISON:  CT Abdomen 02/12/2010. FINDINGS: Lower chest: Minimal lung base scarring or atelectasis. No pericardial or pleural effusion. Hepatobiliary: There are 2 subtle hypodense areas in the liver which are new since 2011. There is a 12 millimeter area at the liver dome on series 2, image 12, and a smaller 8-10 millimeter area in the right hepatic lobe on image 28. Negative gallbladder.  No biliary ductal enlargement. Pancreas: Spiculated and hypoenhancing mass in the body of the pancreas encompassing 22 x 23 x 31 millimeters with invasion and occlusion of the splenic vein. Partial encasement of the proximal SMV which remains patent. Regional mesenteric nodularity which may be small metastatic lymph nodes (coronal image 37, 33). No dilatation of the main pancreatic duct. Spleen: Splenomegaly and trace perisplenic free fluid. Adrenals/Urinary Tract: Adrenal glands remain normal. Chronic left renal atrophy. The right kidney and right ureter are within normal limits. Stomach/Bowel: Negative rectum. Diverticulosis of the sigmoid colon, no definite active inflammation. The more proximal large bowel is within normal limits. No dilated small bowel. Stomach is remarkable for a small hiatal hernia and perigastric varices. Duodenum remains within normal limits. No free air. Vascular/Lymphatic: Aortoiliac calcified atherosclerosis. Major arterial structures are patent. The splenic vein is being thrombosed by the pancreatic mass (series 2, image 23). There are associated perigastric varices. The main portal vein and SMV  remain patent. Peripancreatic nodularity suspicious for small metastatic nodes. Otherwise no overt lymphadenopathy. Reproductive: Diminutive, negative. Other: No pelvic free fluid. Musculoskeletal: Advanced disc degeneration throughout the spine. Degenerative lumbar spondylolisthesis with advanced posterior element degeneration. No acute osseous abnormality identified. IMPRESSION: 1. Spiculated and hypoenhancing pancreatic body tumor 3.1 cm suspicious for Pancreatic Adenocarcinoma. This has occluded the splenic vein (see #2) and partially encases the proximal SMV. There are two small hypoenhancing liver lesions which are new since 2011 and suspicious for early hepatic metastases. 2. Splenomegaly with perigastric varices. 3. Chronic left renal atrophy. Aortic Atherosclerosis (ICD10-I70.0). Electronically Signed   By: Genevie Ann M.D.   On: 09/26/2018 19:34    EKG: Not performed.  Assessment/Plan   1. Pancreatic mass  - Presents with a few weeks of upper abdominal pain and lethargy  - Found to have spiculated pancreatic mass concerning for adenocarcinoma, and 2 small liver lesions concerning for early metastases  - Hospitalists asked to admit to expidite workup  - Spoke with IR regarding possible FNA and was advised to discuss with GI for endoscopic biopsy; will keep NPO after midnight, hold ASA and Plavix, and ask GI to evaluate in am   2. Hypertension  - Continue Norvasc   3. Depression  - Continue Prozac, trazodone, and risperidone   4. History of CVA  - Continue Lipitor, hold ASA and Plavix for possible biopsy   5. Hypothyroidism  - Continue Synthroid   6. UTI  - No systemic sxs  - Pt reports penicillin allergy, uncertain if she's had cephalosporins  - Urine sent for culture, will start ciprofloxacin    DVT prophylaxis: SCD's  Code Status: Full  Family Communication: Daughter updated at bedside Consults called: None Admission status: Observation     Vianne Bulls, MD [facility] Pager [phone]  If 7PM-7AM, please contact night-coverage [email or web address] Password [facility]  09/26/2018, 8:09 PM

## 2018-09-26 NOTE — ED Triage Notes (Signed)
Pt is having pain in her upper abdominal area that has been going on for a few weeks. Is weak and sleeping a lot. No vomiting or diarrhea. Pt is able to eat and drink normal as well. NAD.

## 2018-09-27 ENCOUNTER — Other Ambulatory Visit: Payer: Self-pay

## 2018-09-27 ENCOUNTER — Encounter (HOSPITAL_COMMUNITY): Payer: Self-pay | Admitting: Emergency Medicine

## 2018-09-27 ENCOUNTER — Telehealth: Payer: Self-pay | Admitting: Gastroenterology

## 2018-09-27 DIAGNOSIS — N390 Urinary tract infection, site not specified: Secondary | ICD-10-CM

## 2018-09-27 DIAGNOSIS — I10 Essential (primary) hypertension: Secondary | ICD-10-CM | POA: Diagnosis not present

## 2018-09-27 DIAGNOSIS — Z8673 Personal history of transient ischemic attack (TIA), and cerebral infarction without residual deficits: Secondary | ICD-10-CM | POA: Diagnosis not present

## 2018-09-27 DIAGNOSIS — K8689 Other specified diseases of pancreas: Secondary | ICD-10-CM

## 2018-09-27 DIAGNOSIS — K862 Cyst of pancreas: Secondary | ICD-10-CM | POA: Diagnosis not present

## 2018-09-27 LAB — BASIC METABOLIC PANEL
Anion gap: 9 (ref 5–15)
BUN: 8 mg/dL (ref 8–23)
CHLORIDE: 108 mmol/L (ref 98–111)
CO2: 23 mmol/L (ref 22–32)
Calcium: 8.5 mg/dL — ABNORMAL LOW (ref 8.9–10.3)
Creatinine, Ser: 0.78 mg/dL (ref 0.44–1.00)
GFR calc Af Amer: >60 mL/min (ref >60)
GFR calc non Af Amer: >60 mL/min (ref >60)
Glucose, Bld: 140 mg/dL — ABNORMAL HIGH (ref 70–99)
Potassium: 3.5 mmol/L (ref 3.5–5.1)
Sodium: 140 mmol/L (ref 135–145)

## 2018-09-27 MED ORDER — CLOPIDOGREL BISULFATE 75 MG PO TABS: 75.0000 mg | ORAL_TABLET | Freq: Every day | ORAL | Status: AC

## 2018-09-27 MED ORDER — CIPROFLOXACIN HCL 250 MG PO TABS: 250.0000 mg | ORAL_TABLET | Freq: Two times a day (BID) | ORAL | 0 refills | Status: AC

## 2018-09-27 MED ORDER — ASPIRIN EC 325 MG PO TBEC: 325.0000 mg | DELAYED_RELEASE_TABLET | Freq: Every day | ORAL | Status: AC

## 2018-09-27 MED ORDER — BISACODYL 5 MG PO TBEC: 10.0000 mg | DELAYED_RELEASE_TABLET | Freq: Every day | ORAL | 0 refills | Status: AC | PRN

## 2018-09-27 MED ORDER — POLYETHYLENE GLYCOL 3350 17 G PO PACK: 17.0000 g | PACK | Freq: Every day | ORAL | 0 refills | Status: AC | PRN

## 2018-09-27 MED ORDER — HYDROCODONE-ACETAMINOPHEN 5-325 MG PO TABS: 1.0000 | ORAL_TABLET | Freq: Four times a day (QID) | ORAL | 0 refills | Status: AC | PRN

## 2018-09-27 NOTE — H&P (View-Only) (Signed)
Reason for Consult: pancreatic mass Referring Physician: Hospitalist.   Latoya Morris is an 85 y.o. female.  HPI: Patient in the ED. Present with 3 week hx of epigastric pain. Became unbearable and came to the ED. Pain has been worse in the last 3-4 days. Denies any fever, chills, nausea or vomiting.  No change in her BM, leans toward constipation. Denies any urinary symptoms. Rates her pain 7/10. Per records received Norco while in the ED.  Underwent a CT which revealed IMPRESSION: 1. Spiculated and hypoenhancing pancreatic body tumor 3.1 cm suspicious for Pancreatic Adenocarcinoma. This has occluded the splenic vein (see #2) and partially encases the proximal SMV. There are two small hypoenhancing liver lesions which are new since 2011 and suspicious for early hepatic metastases. 2. Splenomegaly with perigastric varices. 3. Chronic left renal atrophy. Aortic Atherosclerosis (ICD10-I70.0).  Hx signification for CVA and maintained on Plavix. Hx of hypertension, high cholesterol, Alzheimer's disease.   Widowed, 2 children, good health. One daughter had thyroid cancer.    Sister with hx of stomach cancer.                               Past Medical History:  Diagnosis Date  . Alzheimer's dementia ([place])   . Anxiety   . Constipation   . Depression   . Hyperlipidemia   . Hypertension   . Stroke [facility])    Doesn't remember year of stroke other than it not being recent    Past Surgical History:  Procedure Laterality Date  . TUBAL LIGATION      Family History  Problem Relation Age of Onset  . Heart disease Mother   . Stomach cancer Sister   . Throat cancer Brother   . Stroke Sister   . Dementia Sister   . Dementia Sister     Social History:  reports that she quit smoking about 5 years ago. She has a 37.50 pack-year smoking history. She has never used smokeless tobacco. No history on file for alcohol and drug.  Allergies:  Allergies  Allergen Reactions  . Penicillins    Did it involve swelling of the face/tongue/throat, SOB, or low BP? Unknown Did it involve sudden or severe rash/hives, skin peeling, or any reaction on the inside of your mouth or nose? Unknown Did you need to seek medical attention at a hospital or doctor's office? Unknown  When did it last happen? If all above answers are "NO", may proceed with cephalosporin use.     Medications: I have reviewed the patient's current medications.  Results for orders placed or performed during the hospital encounter of 09/26/18 (from the past 48 hour(s))  Urinalysis, Routine w reflex microscopic     Status: Abnormal   Collection Time: 09/26/18  2:09 PM  Result Value Ref Range   Color, Urine YELLOW YELLOW   APPearance HAZY (A) CLEAR   Specific Gravity, Urine 1.016 1.005 - 1.030   pH 5.0 5.0 - 8.0   Glucose, UA NEGATIVE NEGATIVE mg/dL   Hgb urine dipstick MODERATE (A) NEGATIVE   Bilirubin Urine NEGATIVE NEGATIVE   Ketones, ur NEGATIVE NEGATIVE mg/dL   Protein, ur 30 (A) NEGATIVE mg/dL   Nitrite NEGATIVE NEGATIVE   Leukocytes,Ua TRACE (A) NEGATIVE   RBC / HPF 11-20 0 - 5 RBC/hpf   WBC, UA 11-20 0 - 5 WBC/hpf   Bacteria, UA RARE (A) NONE SEEN   Squamous Epithelial / LPF 6-10 0 -  5   Mucus PRESENT    Hyaline Casts, UA PRESENT     Comment: Performed at [facility], [address], [place], [place] [address]  Lipase, blood     Status: None   Collection Time: 09/26/18  2:42 PM  Result Value Ref Range   Lipase 19 11 - 51 U/L    Comment: Performed at [facility], [address], [place], [place] [address]  Comprehensive metabolic panel     Status: Abnormal   Collection Time: 09/26/18  2:42 PM  Result Value Ref Range   Sodium 138 135 - 145 mmol/L   Potassium 3.9 3.5 - 5.1 mmol/L   Chloride 105 98 - 111 mmol/L   CO2 25 22 - 32 mmol/L   Glucose, Bld 183 (H) 70 - 99 mg/dL   BUN 11 8 - 23 mg/dL   Creatinine, Ser 0.85 0.44 - 1.00 mg/dL   Calcium 8.7 (L) 8.9 - 10.3 mg/dL   Total Protein 7.1 6.5  - 8.1 g/dL   Albumin 3.9 3.5 - 5.0 g/dL   AST 32 15 - 41 U/L   ALT 31 0 - 44 U/L   Alkaline Phosphatase 78 38 - 126 U/L   Total Bilirubin 0.7 0.3 - 1.2 mg/dL   GFR calc non Af Amer >60 >60 mL/min   GFR calc Af Amer >60 >60 mL/min   Anion gap 8 5 - 15    Comment: Performed at [facility], [address], [place], [place] [address]  CBC     Status: None   Collection Time: 09/26/18  2:42 PM  Result Value Ref Range   WBC 7.6 4.0 - 10.5 K/uL   RBC 4.19 3.87 - 5.11 MIL/uL   Hemoglobin 13.0 12.0 - 15.0 g/dL   HCT 40.9 36.0 - 46.0 %   MCV 97.6 80.0 - 100.0 fL   MCH 31.0 26.0 - 34.0 pg   MCHC 31.8 30.0 - 36.0 g/dL   RDW 12.2 11.5 - 15.5 %   Platelets 165 150 - 400 K/uL   nRBC 0.0 0.0 - 0.2 %    Comment: Performed at [facility], [address] [place], [place] [address]  Basic metabolic panel     Status: Abnormal   Collection Time: 09/27/18  6:04 AM  Result Value Ref Range   Sodium 140 135 - 145 mmol/L   Potassium 3.5 3.5 - 5.1 mmol/L   Chloride 108 98 - 111 mmol/L   CO2 23 22 - 32 mmol/L   Glucose, Bld 140 (H) 70 - 99 mg/dL   BUN 8 8 - 23 mg/dL   Creatinine, Ser 0.78 0.44 - 1.00 mg/dL   Calcium 8.5 (L) 8.9 - 10.3 mg/dL   GFR calc non Af Amer >60 >60 mL/min   GFR calc Af Amer >60 >60 mL/min   Anion gap 9 5 - 15    Comment: Performed at [facility], [address], [place],  [address]    Ct Abdomen Pelvis W Contrast  Result Date: 09/26/2018 CLINICAL DATA:  85 year old female with abdominal pain and distension for several weeks. EXAM: CT ABDOMEN AND PELVIS WITH CONTRAST TECHNIQUE: Multidetector CT imaging of the abdomen and pelvis was performed using the standard protocol following bolus administration of intravenous contrast. CONTRAST:  161mL OMNIPAQUE IOHEXOL 300 MG/ML  SOLN COMPARISON:  CT Abdomen 02/12/2010. FINDINGS: Lower chest: Minimal lung base scarring or atelectasis. No pericardial or pleural effusion. Hepatobiliary: There are 2 subtle hypodense areas in the liver  which  are new since 2011. There is a 12 millimeter area at the liver dome on series 2, image 12, and a smaller 8-10 millimeter area in the right hepatic lobe on image 28. Negative gallbladder.  No biliary ductal enlargement. Pancreas: Spiculated and hypoenhancing mass in the body of the pancreas encompassing 22 x 23 x 31 millimeters with invasion and occlusion of the splenic vein. Partial encasement of the proximal SMV which remains patent. Regional mesenteric nodularity which may be small metastatic lymph nodes (coronal image 37, 33). No dilatation of the main pancreatic duct. Spleen: Splenomegaly and trace perisplenic free fluid. Adrenals/Urinary Tract: Adrenal glands remain normal. Chronic left renal atrophy. The right kidney and right ureter are within normal limits. Stomach/Bowel: Negative rectum. Diverticulosis of the sigmoid colon, no definite active inflammation. The more proximal large bowel is within normal limits. No dilated small bowel. Stomach is remarkable for a small hiatal hernia and perigastric varices. Duodenum remains within normal limits. No free air. Vascular/Lymphatic: Aortoiliac calcified atherosclerosis. Major arterial structures are patent. The splenic vein is being thrombosed by the pancreatic mass (series 2, image 23). There are associated perigastric varices. The main portal vein and SMV remain patent. Peripancreatic nodularity suspicious for small metastatic nodes. Otherwise no overt lymphadenopathy. Reproductive: Diminutive, negative. Other: No pelvic free fluid. Musculoskeletal: Advanced disc degeneration throughout the spine. Degenerative lumbar spondylolisthesis with advanced posterior element degeneration. No acute osseous abnormality identified. IMPRESSION: 1. Spiculated and hypoenhancing pancreatic body tumor 3.1 cm suspicious for Pancreatic Adenocarcinoma. This has occluded the splenic vein (see #2) and partially encases the proximal SMV. There are two small hypoenhancing liver  lesions which are new since 2011 and suspicious for early hepatic metastases. 2. Splenomegaly with perigastric varices. 3. Chronic left renal atrophy. Aortic Atherosclerosis (ICD10-I70.0). Electronically Signed   By: Genevie Ann M.D.   On: 09/26/2018 19:34    ROS Blood pressure (!) 152/64, pulse 68, temperature 99 F (37.2 C), temperature source Oral, resp. rate 18, height 5\' 5"  (1.651 m), weight 86.2 kg, SpO2 97 %. Physical Exam  Alert and oriented. Skin warm and dry. Oral mucosa is moist.   . Sclera anicteric, conjunctivae is pink. Thyroid not enlarged. No cervical lymphadenopathy. Lungs clear. Heart regular rate and rhythm.  Abdomen is soft. Bowel sounds are positive. No hepatomegaly. No abdominal masses felt. Epigastric tenderness.  No edema to lower extremities.         Assessment/Plan:  Pancreatic mass on CT. ? Liver mass. Dr. Laural Golden is aware.  Latoya Morris L Aamya Orellana 09/27/2018, 8:46 AM

## 2018-09-27 NOTE — Plan of Care (Signed)
  Problem: Education: Goal: Knowledge of General Education information will improve Description Including pain rating scale, medication(s)/side effects and non-pharmacologic comfort measures 09/27/2018 1748 by Milderd Meager, RN Outcome: Adequate for Discharge 09/27/2018 1426 by Milderd Meager, RN Outcome: Progressing   Problem: Health Behavior/Discharge Planning: Goal: Ability to manage health-related needs will improve 09/27/2018 1748 by Milderd Meager, RN Outcome: Adequate for Discharge 09/27/2018 1426 by Milderd Meager, RN Outcome: Progressing   Problem: Clinical Measurements: Goal: Ability to maintain clinical measurements within normal limits will improve 09/27/2018 1748 by Milderd Meager, RN Outcome: Adequate for Discharge 09/27/2018 1426 by Milderd Meager, RN Outcome: Progressing Goal: Will remain free from infection 09/27/2018 1748 by Milderd Meager, RN Outcome: Adequate for Discharge 09/27/2018 1426 by Milderd Meager, RN Outcome: Progressing Goal: Diagnostic test results will improve 09/27/2018 1748 by Milderd Meager, RN Outcome: Adequate for Discharge 09/27/2018 1426 by Milderd Meager, RN Outcome: Progressing Goal: Respiratory complications will improve 09/27/2018 1748 by Milderd Meager, RN Outcome: Adequate for Discharge 09/27/2018 1426 by Milderd Meager, RN Outcome: Progressing Goal: Cardiovascular complication will be avoided 09/27/2018 1748 by Milderd Meager, RN Outcome: Adequate for Discharge 09/27/2018 1426 by Milderd Meager, RN Outcome: Progressing   Problem: Activity: Goal: Risk for activity intolerance will decrease 09/27/2018 1748 by Milderd Meager, RN Outcome: Adequate for Discharge 09/27/2018 1426 by Milderd Meager, RN Outcome: Progressing   Problem: Nutrition: Goal: Adequate nutrition will be maintained 09/27/2018 1748 by Milderd Meager, RN Outcome: Adequate for Discharge 09/27/2018 1426 by  Milderd Meager, RN Outcome: Progressing   Problem: Coping: Goal: Level of anxiety will decrease 09/27/2018 1748 by Milderd Meager, RN Outcome: Adequate for Discharge 09/27/2018 1426 by Milderd Meager, RN Outcome: Progressing   Problem: Elimination: Goal: Will not experience complications related to bowel motility 09/27/2018 1748 by Milderd Meager, RN Outcome: Adequate for Discharge 09/27/2018 1426 by Milderd Meager, RN Outcome: Progressing Goal: Will not experience complications related to urinary retention 09/27/2018 1748 by Milderd Meager, RN Outcome: Adequate for Discharge 09/27/2018 1426 by Milderd Meager, RN Outcome: Progressing   Problem: Pain Managment: Goal: General experience of comfort will improve 09/27/2018 1748 by Milderd Meager, RN Outcome: Adequate for Discharge 09/27/2018 1426 by Milderd Meager, RN Outcome: Progressing   Problem: Safety: Goal: Ability to remain free from injury will improve 09/27/2018 1748 by Milderd Meager, RN Outcome: Adequate for Discharge 09/27/2018 1426 by Milderd Meager, RN Outcome: Progressing   Problem: Skin Integrity: Goal: Risk for impaired skin integrity will decrease 09/27/2018 1748 by Milderd Meager, RN Outcome: Adequate for Discharge 09/27/2018 1426 by Milderd Meager, RN Outcome: Progressing

## 2018-09-27 NOTE — Consult Note (Signed)
Reason for Consult: pancreatic mass Referring Physician: Hospitalist.   Latoya Morris is an 85 y.o. female.  HPI: Patient in the ED. Present with 3 week hx of epigastric pain. Became unbearable and came to the ED. Pain has been worse in the last 3-4 days. Denies any fever, chills, nausea or vomiting.  No change in her BM, leans toward constipation. Denies any urinary symptoms. Rates her pain 7/10. Per records received Norco while in the ED.  Underwent a CT which revealed IMPRESSION: 1. Spiculated and hypoenhancing pancreatic body tumor 3.1 cm suspicious for Pancreatic Adenocarcinoma. This has occluded the splenic vein (see #2) and partially encases the proximal SMV. There are two small hypoenhancing liver lesions which are new since 2011 and suspicious for early hepatic metastases. 2. Splenomegaly with perigastric varices. 3. Chronic left renal atrophy. Aortic Atherosclerosis (ICD10-I70.0).  Hx signification for CVA and maintained on Plavix. Hx of hypertension, high cholesterol, Alzheimer's disease.   Widowed, 2 children, good health. One daughter had thyroid cancer.    Sister with hx of stomach cancer.                               Past Medical History:  Diagnosis Date  . Alzheimer's dementia ([place])   . Anxiety   . Constipation   . Depression   . Hyperlipidemia   . Hypertension   . Stroke [facility])    Doesn't remember year of stroke other than it not being recent    Past Surgical History:  Procedure Laterality Date  . TUBAL LIGATION      Family History  Problem Relation Age of Onset  . Heart disease Mother   . Stomach cancer Sister   . Throat cancer Brother   . Stroke Sister   . Dementia Sister   . Dementia Sister     Social History:  reports that she quit smoking about 5 years ago. She has a 37.50 pack-year smoking history. She has never used smokeless tobacco. No history on file for alcohol and drug.  Allergies:  Allergies  Allergen Reactions  . Penicillins    Did it involve swelling of the face/tongue/throat, SOB, or low BP? Unknown Did it involve sudden or severe rash/hives, skin peeling, or any reaction on the inside of your mouth or nose? Unknown Did you need to seek medical attention at a hospital or doctor's office? Unknown  When did it last happen? If all above answers are "NO", may proceed with cephalosporin use.     Medications: I have reviewed the patient's current medications.  Results for orders placed or performed during the hospital encounter of 09/26/18 (from the past 48 hour(s))  Urinalysis, Routine w reflex microscopic     Status: Abnormal   Collection Time: 09/26/18  2:09 PM  Result Value Ref Range   Color, Urine YELLOW YELLOW   APPearance HAZY (A) CLEAR   Specific Gravity, Urine 1.016 1.005 - 1.030   pH 5.0 5.0 - 8.0   Glucose, UA NEGATIVE NEGATIVE mg/dL   Hgb urine dipstick MODERATE (A) NEGATIVE   Bilirubin Urine NEGATIVE NEGATIVE   Ketones, ur NEGATIVE NEGATIVE mg/dL   Protein, ur 30 (A) NEGATIVE mg/dL   Nitrite NEGATIVE NEGATIVE   Leukocytes,Ua TRACE (A) NEGATIVE   RBC / HPF 11-20 0 - 5 RBC/hpf   WBC, UA 11-20 0 - 5 WBC/hpf   Bacteria, UA RARE (A) NONE SEEN   Squamous Epithelial / LPF 6-10 0 -  5   Mucus PRESENT    Hyaline Casts, UA PRESENT     Comment: Performed at [facility], [address], [place], [place] [address]  Lipase, blood     Status: None   Collection Time: 09/26/18  2:42 PM  Result Value Ref Range   Lipase 19 11 - 51 U/L    Comment: Performed at [facility], [address] [place], [place] [address]  Comprehensive metabolic panel     Status: Abnormal   Collection Time: 09/26/18  2:42 PM  Result Value Ref Range   Sodium 138 135 - 145 mmol/L   Potassium 3.9 3.5 - 5.1 mmol/L   Chloride 105 98 - 111 mmol/L   CO2 25 22 - 32 mmol/L   Glucose, Bld 183 (H) 70 - 99 mg/dL   BUN 11 8 - 23 mg/dL   Creatinine, Ser 0.85 0.44 - 1.00 mg/dL   Calcium 8.7 (L) 8.9 - 10.3 mg/dL   Total Protein 7.1 6.5  - 8.1 g/dL   Albumin 3.9 3.5 - 5.0 g/dL   AST 32 15 - 41 U/L   ALT 31 0 - 44 U/L   Alkaline Phosphatase 78 38 - 126 U/L   Total Bilirubin 0.7 0.3 - 1.2 mg/dL   GFR calc non Af Amer >60 >60 mL/min   GFR calc Af Amer >60 >60 mL/min   Anion gap 8 5 - 15    Comment: Performed at [facility], [address], [place], [place] [address]  CBC     Status: None   Collection Time: 09/26/18  2:42 PM  Result Value Ref Range   WBC 7.6 4.0 - 10.5 K/uL   RBC 4.19 3.87 - 5.11 MIL/uL   Hemoglobin 13.0 12.0 - 15.0 g/dL   HCT 40.9 36.0 - 46.0 %   MCV 97.6 80.0 - 100.0 fL   MCH 31.0 26.0 - 34.0 pg   MCHC 31.8 30.0 - 36.0 g/dL   RDW 12.2 11.5 - 15.5 %   Platelets 165 150 - 400 K/uL   nRBC 0.0 0.0 - 0.2 %    Comment: Performed at [facility], [address], [place],  [address]  Basic metabolic panel     Status: Abnormal   Collection Time: 09/27/18  6:04 AM  Result Value Ref Range   Sodium 140 135 - 145 mmol/L   Potassium 3.5 3.5 - 5.1 mmol/L   Chloride 108 98 - 111 mmol/L   CO2 23 22 - 32 mmol/L   Glucose, Bld 140 (H) 70 - 99 mg/dL   BUN 8 8 - 23 mg/dL   Creatinine, Ser 0.78 0.44 - 1.00 mg/dL   Calcium 8.5 (L) 8.9 - 10.3 mg/dL   GFR calc non Af Amer >60 >60 mL/min   GFR calc Af Amer >60 >60 mL/min   Anion gap 9 5 - 15    Comment: Performed at [facility], [address], [place],  [address]    Ct Abdomen Pelvis W Contrast  Result Date: 09/26/2018 CLINICAL DATA:  85 year old female with abdominal pain and distension for several weeks. EXAM: CT ABDOMEN AND PELVIS WITH CONTRAST TECHNIQUE: Multidetector CT imaging of the abdomen and pelvis was performed using the standard protocol following bolus administration of intravenous contrast. CONTRAST:  144mL OMNIPAQUE IOHEXOL 300 MG/ML  SOLN COMPARISON:  CT Abdomen 02/12/2010. FINDINGS: Lower chest: Minimal lung base scarring or atelectasis. No pericardial or pleural effusion. Hepatobiliary: There are 2 subtle hypodense areas in the liver  which  are new since 2011. There is a 12 millimeter area at the liver dome on series 2, image 12, and a smaller 8-10 millimeter area in the right hepatic lobe on image 28. Negative gallbladder.  No biliary ductal enlargement. Pancreas: Spiculated and hypoenhancing mass in the body of the pancreas encompassing 22 x 23 x 31 millimeters with invasion and occlusion of the splenic vein. Partial encasement of the proximal SMV which remains patent. Regional mesenteric nodularity which may be small metastatic lymph nodes (coronal image 37, 33). No dilatation of the main pancreatic duct. Spleen: Splenomegaly and trace perisplenic free fluid. Adrenals/Urinary Tract: Adrenal glands remain normal. Chronic left renal atrophy. The right kidney and right ureter are within normal limits. Stomach/Bowel: Negative rectum. Diverticulosis of the sigmoid colon, no definite active inflammation. The more proximal large bowel is within normal limits. No dilated small bowel. Stomach is remarkable for a small hiatal hernia and perigastric varices. Duodenum remains within normal limits. No free air. Vascular/Lymphatic: Aortoiliac calcified atherosclerosis. Major arterial structures are patent. The splenic vein is being thrombosed by the pancreatic mass (series 2, image 23). There are associated perigastric varices. The main portal vein and SMV remain patent. Peripancreatic nodularity suspicious for small metastatic nodes. Otherwise no overt lymphadenopathy. Reproductive: Diminutive, negative. Other: No pelvic free fluid. Musculoskeletal: Advanced disc degeneration throughout the spine. Degenerative lumbar spondylolisthesis with advanced posterior element degeneration. No acute osseous abnormality identified. IMPRESSION: 1. Spiculated and hypoenhancing pancreatic body tumor 3.1 cm suspicious for Pancreatic Adenocarcinoma. This has occluded the splenic vein (see #2) and partially encases the proximal SMV. There are two small hypoenhancing liver  lesions which are new since 2011 and suspicious for early hepatic metastases. 2. Splenomegaly with perigastric varices. 3. Chronic left renal atrophy. Aortic Atherosclerosis (ICD10-I70.0). Electronically Signed   By: Genevie Ann M.D.   On: 09/26/2018 19:34    ROS Blood pressure (!) 152/64, pulse 68, temperature 99 F (37.2 C), temperature source Oral, resp. rate 18, height 5\' 5"  (1.651 m), weight 86.2 kg, SpO2 97 %. Physical Exam  Alert and oriented. Skin warm and dry. Oral mucosa is moist.   . Sclera anicteric, conjunctivae is pink. Thyroid not enlarged. No cervical lymphadenopathy. Lungs clear. Heart regular rate and rhythm.  Abdomen is soft. Bowel sounds are positive. No hepatomegaly. No abdominal masses felt. Epigastric tenderness.  No edema to lower extremities.         Assessment/Plan:  Pancreatic mass on CT. ? Liver mass. Dr. Laural Golden is aware.  Latoya Morris 09/27/2018, 8:46 AM

## 2018-09-27 NOTE — Plan of Care (Signed)

## 2018-09-27 NOTE — Progress Notes (Signed)
Patient ready for discharge home with daughter who is at bedside. Reviewed all discharge instructions, prescriptions and f/u appointments.

## 2018-09-27 NOTE — Telephone Encounter (Signed)
The pt was scheduled for EUS on 2/27 at 12 am [facility] with Dr Ardis Hughs  Letter sent to Dr Melrose Nakayama regarding Plavix.  Also instructions mailed to the pt. She is currently admitted at [facility].

## 2018-09-27 NOTE — Discharge Summary (Signed)
Physician Discharge Summary  Latoya Morris HGD:[id number] DOB: 05/16/1934 DOA: 09/26/2018  PCP: Kirk Ruths, MD  Admit date: 09/26/2018 Discharge date: 09/27/2018  Time spent: 30 minutes  Recommendations for Outpatient Follow-up:  1. Reassess pain control and provide adjustment on analgesic regimen as needed. 2. Repeat CMET to follow electrolytes, renal function and LFTs. 3. Follow final culture results from recent urinalysis and assess resolution of patient's symptoms.   Discharge Diagnoses:  Principal Problem:   Pancreatic mass Active Problems:   Depression   Essential hypertension   History of CVA (cerebrovascular accident)   Acute lower UTI   Discharge Condition: Stable and no complaining of any vomiting.  After evaluation by GI service patient has a schedule outpatient follow-up for endoscopic ultrasound/biopsy of this new suspected adenocarcinoma of the pancreas.  Diet recommendation: Heart healthy diet.  Filed Weights   09/26/18 1406 09/27/18 1307  Weight: 86.2 kg 83.9 kg    History of present illness:  As per H&P written by Dr. Myna Hidalgo on 09/26/2018 85 y.o. female with medical history significant for hypothyroidism, hypertension, recurrent UTI, Alzheimer dementia, history of CVA, and depression with anxiety, now presenting to the emergency department for evaluation of upper abdominal pain and fatigue.  Patient began to complain of some upper abdominal discomfort approximately 3 weeks ago, but this worsened significantly over the past 3 days.  She describes this as a dull aching pain localized to the upper abdomen in the center.  This has been associated with fatigue, but no nausea, vomiting, diarrhea, weight loss, night sweats, fevers, or chills.  Patient also reports some dysuria for the past few days without any flank pain or fevers.  No gross hematuria.  Hospital Course:  1-pancreatic mass:  -With CT scan images suggesting adenocarcinoma of the pancreas with  already metastatic lesions to the liver. -Case discussed with GI service who has orchestrated outpatient endoscopic ultrasound and biopsy on 10/05/2018 -Patient has been instructed to get herself off aspirin and Plavix also biopsy completed. -Pain medication and laxatives has been provided at discharge to assist with symptomatic management. -Once biopsy completed GI service will arrange for outpatient follow-up with oncologist.  2-hypertension -Resume the use of Norvasc -Heart healthy diet has been encouraged.  3-hypothyroidism -Continue the use of Synthroid  4-history of CVA -Continue Lipitor -Holding aspirin and Plavix for upcoming biopsy. -Resume once okay by GI.  5-depression -No suicidal ideation or hallucination -Continue Prozac, trazodone and risperidone.  6-UTI -Follow final culture results -Since there is no nausea or vomiting patient has been discharged on ciprofloxacin with intention to treat for a total of 3 more days and complete antibiotic therapy. -advice to keep herself well-hydrated.  Procedures:  Below for x-ray reports.  Consultations:  Gastroenterology service.  Discharge Exam: Vitals:   09/27/18 1030 09/27/18 1307  BP: (!) 195/62 (!) 149/61  Pulse:  65  Resp:  18  Temp:  98.8 F (37.1 C)  SpO2:  96%    General: Afebrile, no jaundice, no icterus.  Patient denies nausea, vomiting, chest pain or shortness of breath. Cardiovascular: S1-S2, no rubs, no gallops. Respiratory: Clear to auscultation bilaterally. Abdomen: Soft, positive bowel sounds, no distention.  Discharge Instructions   Discharge Instructions    Discharge instructions   Complete by:  As directed    Maintain adequate hydration Increase fiber intake in your diet Use medication as needed for pain and whole aspirin/Plavix until biopsy completed next week. Use as needed lace at this medication to assist with constipation  Complete 3 days of antibiotics as instructed for UTI      Allergies as of 09/27/2018      Reactions   Penicillins    Did it involve swelling of the face/tongue/throat, SOB, or low BP? Unknown Did it involve sudden or severe rash/hives, skin peeling, or any reaction on the inside of your mouth or nose? Unknown Did you need to seek medical attention at a hospital or doctor's office? Unknown When did it last happen? If all above answers are "NO", may proceed with cephalosporin use.      Medication List    TAKE these medications   albuterol (2.5 MG/3ML) 0.083% nebulizer solution Commonly known as:  PROVENTIL Take 3 mLs (2.5 mg total) by nebulization every 6 (six) hours as needed for wheezing or shortness of breath.   amLODipine 5 MG tablet Commonly known as:  NORVASC Take 5 mg by mouth daily.   aspirin EC 325 MG tablet Take 1 tablet (325 mg total) by mouth daily. Hold until biopsy completed. What changed:  additional instructions   atorvastatin 40 MG tablet Commonly known as:  LIPITOR Take 40 mg by mouth every evening.   Biotin 10000 MCG Tabs Take 1 tablet by mouth daily.   bisacodyl 5 MG EC tablet Commonly known as:  DULCOLAX Take 2 tablets (10 mg total) by mouth daily as needed for moderate constipation. What changed:  how much to take   ciprofloxacin 250 MG tablet Commonly known as:  CIPRO Take 1 tablet (250 mg total) by mouth 2 (two) times daily for 3 days.   clopidogrel 75 MG tablet Commonly known as:  PLAVIX Take 1 tablet (75 mg total) by mouth daily. Hold until biopsy completed. What changed:  additional instructions   FLUoxetine 20 MG capsule Commonly known as:  PROZAC Take 40 mg by mouth every morning.   gabapentin 300 MG capsule Commonly known as:  NEURONTIN Take 300 mg by mouth 2 (two) times daily.   GENTEAL MILD 0.2 % Soln Generic drug:  Hypromellose Apply 1-2 drops to eye daily as needed (for dry eye relief).   HYDROcodone-acetaminophen 5-325 MG tablet Commonly known as:  NORCO/VICODIN Take 1-2  tablets by mouth every 6 (six) hours as needed for up to 7 days for severe pain.   levothyroxine 25 MCG tablet Commonly known as:  SYNTHROID, LEVOTHROID Take 25 mcg by mouth every morning.   polyethylene glycol packet Commonly known as:  MIRALAX Take 17 g by mouth daily as needed.   pyrithione zinc 1 % shampoo Commonly known as:  HEAD AND SHOULDERS Apply topically daily as needed for itching.   risperiDONE 0.25 MG tablet Commonly known as:  RISPERDAL Take 0.25 mg by mouth at bedtime.   traMADol 50 MG tablet Commonly known as:  ULTRAM Take 50 mg by mouth every 6 (six) hours as needed. For pain   traZODone 50 MG tablet Commonly known as:  DESYREL Take 250 mg by mouth at bedtime.      Allergies  Allergen Reactions  . Penicillins     Did it involve swelling of the face/tongue/throat, SOB, or low BP? Unknown Did it involve sudden or severe rash/hives, skin peeling, or any reaction on the inside of your mouth or nose? Unknown Did you need to seek medical attention at a hospital or doctor's office? Unknown  When did it last happen? If all above answers are "NO", may proceed with cephalosporin use.    Follow-up Information    Kirk Ruths,  MD. Schedule an appointment as soon as possible for a visit in 10 day(s).   Specialty:  Internal Medicine Contact information: [place] [address] [phone]            The results of significant diagnostics from this hospitalization (including imaging, microbiology, ancillary and laboratory) are listed below for reference.    Significant Diagnostic Studies: Ct Abdomen Pelvis W Contrast  Result Date: 09/26/2018 CLINICAL DATA:  85 year old female with abdominal pain and distension for several weeks. EXAM: CT ABDOMEN AND PELVIS WITH CONTRAST TECHNIQUE: Multidetector CT imaging of the abdomen and pelvis was performed using the standard protocol following bolus  administration of intravenous contrast. CONTRAST:  141mL OMNIPAQUE IOHEXOL 300 MG/ML  SOLN COMPARISON:  CT Abdomen 02/12/2010. FINDINGS: Lower chest: Minimal lung base scarring or atelectasis. No pericardial or pleural effusion. Hepatobiliary: There are 2 subtle hypodense areas in the liver which are new since 2011. There is a 12 millimeter area at the liver dome on series 2, image 12, and a smaller 8-10 millimeter area in the right hepatic lobe on image 28. Negative gallbladder.  No biliary ductal enlargement. Pancreas: Spiculated and hypoenhancing mass in the body of the pancreas encompassing 22 x 23 x 31 millimeters with invasion and occlusion of the splenic vein. Partial encasement of the proximal SMV which remains patent. Regional mesenteric nodularity which may be small metastatic lymph nodes (coronal image 37, 33). No dilatation of the main pancreatic duct. Spleen: Splenomegaly and trace perisplenic free fluid. Adrenals/Urinary Tract: Adrenal glands remain normal. Chronic left renal atrophy. The right kidney and right ureter are within normal limits. Stomach/Bowel: Negative rectum. Diverticulosis of the sigmoid colon, no definite active inflammation. The more proximal large bowel is within normal limits. No dilated small bowel. Stomach is remarkable for a small hiatal hernia and perigastric varices. Duodenum remains within normal limits. No free air. Vascular/Lymphatic: Aortoiliac calcified atherosclerosis. Major arterial structures are patent. The splenic vein is being thrombosed by the pancreatic mass (series 2, image 23). There are associated perigastric varices. The main portal vein and SMV remain patent. Peripancreatic nodularity suspicious for small metastatic nodes. Otherwise no overt lymphadenopathy. Reproductive: Diminutive, negative. Other: No pelvic free fluid. Musculoskeletal: Advanced disc degeneration throughout the spine. Degenerative lumbar spondylolisthesis with advanced posterior element  degeneration. No acute osseous abnormality identified. IMPRESSION: 1. Spiculated and hypoenhancing pancreatic body tumor 3.1 cm suspicious for Pancreatic Adenocarcinoma. This has occluded the splenic vein (see #2) and partially encases the proximal SMV. There are two small hypoenhancing liver lesions which are new since 2011 and suspicious for early hepatic metastases. 2. Splenomegaly with perigastric varices. 3. Chronic left renal atrophy. Aortic Atherosclerosis (ICD10-I70.0). Electronically Signed   By: Genevie Ann M.D.   On: 09/26/2018 19:34   Labs: Basic Metabolic Panel: Recent Labs  Lab 09/26/18 1442 09/27/18 0604  NA 138 140  K 3.9 3.5  CL 105 108  CO2 25 23  GLUCOSE 183* 140*  BUN 11 8  CREATININE 0.85 0.78  CALCIUM 8.7* 8.5*   Liver Function Tests: Recent Labs  Lab 09/26/18 1442  AST 32  ALT 31  ALKPHOS 78  BILITOT 0.7  PROT 7.1  ALBUMIN 3.9   Recent Labs  Lab 09/26/18 1442  LIPASE 19   CBC: Recent Labs  Lab 09/26/18 1442  WBC 7.6  HGB 13.0  HCT 40.9  MCV 97.6  PLT 165    Signed:  Barton Dubois MD.  [facility] 09/27/2018, 5:25  PM

## 2018-09-27 NOTE — Telephone Encounter (Signed)
I spoke with Dr. Laural Golden about her today.  She is currently admitted at [facility] for abdominal pain that is likely from newly found pancreatic mass in the body of her pancreas.  She is not jaundiced.  I reviewed the CT images and this is very likely pancreatic adenocarcinoma.  She is on Plavix daily.   Patty, Can you please get in touch with her and arrange endoscopic ultrasound with fine-needle aspiration next Thursday, February 27 for pancreatic mass.  Her last Plavix dose should be this coming Saturday.  Please communicate with her cardiologist or the Plavix prescribing physician to make sure that they feel that is safe.   Thanks.

## 2018-09-28 LAB — URINE CULTURE: Culture: <10000 — AB

## 2018-09-28 NOTE — Telephone Encounter (Signed)
The patient has been notified of this information and all questions answered. EUS scheduled, pt instructed and medications reviewed.  Patient instructions mailed to home.  Patient to call with any questions or concerns. She was advised to stop plavix on Saturday by the physician at the hospital during discharge.

## 2018-10-03 ENCOUNTER — Telehealth: Payer: Self-pay | Admitting: Gastroenterology

## 2018-10-03 NOTE — Telephone Encounter (Signed)
Left message on machine to call back  

## 2018-10-03 NOTE — Telephone Encounter (Signed)
Pt daughter called with some questions about the upcoming appt that is sched. And the one that is to be sched.

## 2018-10-04 ENCOUNTER — Encounter (HOSPITAL_COMMUNITY): Payer: Self-pay | Admitting: *Deleted

## 2018-10-04 NOTE — Telephone Encounter (Signed)
PT daughter is returning your call

## 2018-10-04 NOTE — Telephone Encounter (Signed)
I did confirm that the pt has held her plavix since Saturday per Dr Dyann Kief. (Sig: Take 1 tablet (75 mg total) by mouth daily. Hold until biopsy completed. Dr Dyann Kief )  The daughter was also concerned about an oncology referral. The pt daughter was advised that any referral or follow up appts needed would be handled by our office.  The pt has been advised of the information and verbalized understanding.

## 2018-10-05 ENCOUNTER — Ambulatory Visit (HOSPITAL_COMMUNITY): Payer: Medicare Other | Admitting: Anesthesiology

## 2018-10-05 ENCOUNTER — Other Ambulatory Visit: Payer: Self-pay

## 2018-10-05 ENCOUNTER — Encounter (HOSPITAL_COMMUNITY)
Admission: RE | Disposition: A | Discharge status: Home / Self Care | Payer: Self-pay | Source: Home / Self Care | Attending: Gastroenterology

## 2018-10-05 ENCOUNTER — Telehealth: Payer: Self-pay

## 2018-10-05 ENCOUNTER — Encounter (HOSPITAL_COMMUNITY): Payer: Self-pay | Admitting: *Deleted

## 2018-10-05 ENCOUNTER — Ambulatory Visit (HOSPITAL_COMMUNITY)
Admission: RE | Admit: 2018-10-05 | Discharge: 2018-10-05 | Disposition: A | Discharge status: Home / Self Care | Payer: Medicare Other | Attending: Gastroenterology | Admitting: Gastroenterology

## 2018-10-05 DIAGNOSIS — E7800 Pure hypercholesterolemia, unspecified: Secondary | ICD-10-CM | POA: Diagnosis not present

## 2018-10-05 DIAGNOSIS — F0280 Dementia in other diseases classified elsewhere without behavioral disturbance: Secondary | ICD-10-CM | POA: Insufficient documentation

## 2018-10-05 DIAGNOSIS — E785 Hyperlipidemia, unspecified: Secondary | ICD-10-CM | POA: Diagnosis not present

## 2018-10-05 DIAGNOSIS — I700 Atherosclerosis of aorta: Secondary | ICD-10-CM | POA: Insufficient documentation

## 2018-10-05 DIAGNOSIS — K5900 Constipation, unspecified: Secondary | ICD-10-CM | POA: Diagnosis not present

## 2018-10-05 DIAGNOSIS — K449 Diaphragmatic hernia without obstruction or gangrene: Secondary | ICD-10-CM | POA: Insufficient documentation

## 2018-10-05 DIAGNOSIS — K8689 Other specified diseases of pancreas: Secondary | ICD-10-CM

## 2018-10-05 DIAGNOSIS — K5730 Diverticulosis of large intestine without perforation or abscess without bleeding: Secondary | ICD-10-CM | POA: Insufficient documentation

## 2018-10-05 DIAGNOSIS — M1990 Unspecified osteoarthritis, unspecified site: Secondary | ICD-10-CM | POA: Insufficient documentation

## 2018-10-05 DIAGNOSIS — Z880 Allergy status to penicillin: Secondary | ICD-10-CM | POA: Insufficient documentation

## 2018-10-05 DIAGNOSIS — F419 Anxiety disorder, unspecified: Secondary | ICD-10-CM | POA: Insufficient documentation

## 2018-10-05 DIAGNOSIS — K869 Disease of pancreas, unspecified: Secondary | ICD-10-CM | POA: Diagnosis present

## 2018-10-05 DIAGNOSIS — Z87891 Personal history of nicotine dependence: Secondary | ICD-10-CM | POA: Insufficient documentation

## 2018-10-05 DIAGNOSIS — F329 Major depressive disorder, single episode, unspecified: Secondary | ICD-10-CM | POA: Diagnosis not present

## 2018-10-05 DIAGNOSIS — I10 Essential (primary) hypertension: Secondary | ICD-10-CM | POA: Insufficient documentation

## 2018-10-05 DIAGNOSIS — G309 Alzheimer's disease, unspecified: Secondary | ICD-10-CM | POA: Diagnosis not present

## 2018-10-05 DIAGNOSIS — Z7902 Long term (current) use of antithrombotics/antiplatelets: Secondary | ICD-10-CM | POA: Insufficient documentation

## 2018-10-05 DIAGNOSIS — M4316 Spondylolisthesis, lumbar region: Secondary | ICD-10-CM | POA: Insufficient documentation

## 2018-10-05 DIAGNOSIS — Z8673 Personal history of transient ischemic attack (TIA), and cerebral infarction without residual deficits: Secondary | ICD-10-CM | POA: Diagnosis not present

## 2018-10-05 HISTORY — PX: ESOPHAGOGASTRODUODENOSCOPY: SHX5428

## 2018-10-05 HISTORY — PX: FINE NEEDLE ASPIRATION: SHX5430

## 2018-10-05 HISTORY — PX: EUS: SHX5427

## 2018-10-05 SURGERY — UPPER ENDOSCOPIC ULTRASOUND (EUS) RADIAL
Anesthesia: Monitor Anesthesia Care

## 2018-10-05 MED ORDER — LIDOCAINE 2% (20 MG/ML) 5 ML SYRINGE
INTRAMUSCULAR | Status: DC | PRN
  Administered 2018-10-05: 40 mg via INTRAVENOUS

## 2018-10-05 MED ORDER — HYDROCODONE-ACETAMINOPHEN 5-325 MG PO TABS: 1.0000 | ORAL_TABLET | Freq: Four times a day (QID) | ORAL | 0 refills | Status: DC | PRN

## 2018-10-05 MED ORDER — SODIUM CHLORIDE 0.9 % IV SOLN: INTRAVENOUS | Status: DC

## 2018-10-05 MED ORDER — PROPOFOL 10 MG/ML IV BOLUS
INTRAVENOUS | Status: AC
  Filled 2018-10-05: qty 40

## 2018-10-05 MED ORDER — LACTATED RINGERS IV SOLN
INTRAVENOUS | Status: DC
  Administered 2018-10-05: 09:00:00 via INTRAVENOUS

## 2018-10-05 MED ORDER — PROPOFOL 10 MG/ML IV BOLUS
INTRAVENOUS | Status: DC | PRN
  Administered 2018-10-05 (×15): 20 mg via INTRAVENOUS
  Administered 2018-10-05: 30 mg via INTRAVENOUS
  Administered 2018-10-05: 20 mg via INTRAVENOUS

## 2018-10-05 NOTE — Interval H&P Note (Signed)
History and Physical Interval Note:  10/05/2018 9:25 AM  Latoya Morris  has presented today for surgery, with the diagnosis of pancreatic mass  The various methods of treatment have been discussed with the patient and family. After consideration of risks, benefits and other options for treatment, the patient has consented to  Procedure(s): UPPER ENDOSCOPIC ULTRASOUND (EUS) RADIAL (N/A) as a surgical intervention .  The patient's history has been reviewed, patient examined, no change in status, stable for surgery.  I have reviewed the patient's chart and labs.  Questions were answered to the patient's satisfaction.     Milus Banister

## 2018-10-05 NOTE — Anesthesia Preprocedure Evaluation (Signed)
Anesthesia Evaluation  Patient identified by MRN, date of birth, ID band Patient awake    Reviewed: Allergy & Precautions, NPO status , Patient's Chart, lab work & pertinent test results  Airway Mallampati: II  TM Distance: >3 FB Neck ROM: Full    Dental   Pulmonary former smoker,    breath sounds clear to auscultation       Cardiovascular hypertension, Pt. on medications  Rhythm:Regular Rate:Normal     Neuro/Psych CVA    GI/Hepatic negative GI ROS, Neg liver ROS,   Endo/Other  negative endocrine ROS  Renal/GU negative Renal ROS     Musculoskeletal  (+) Arthritis ,   Abdominal   Peds  Hematology negative hematology ROS (+)   Anesthesia Other Findings   Reproductive/Obstetrics                             Anesthesia Physical Anesthesia Plan  ASA: III  Anesthesia Plan: MAC   Post-op Pain Management:    Induction: Intravenous  PONV Risk Score and Plan: 2 and Propofol infusion, Ondansetron and Treatment may vary due to age or medical condition  Airway Management Planned: Natural Airway and Nasal Cannula  Additional Equipment:   Intra-op Plan:   Post-operative Plan:   Informed Consent: I have reviewed the patients History and Physical, chart, labs and discussed the procedure including the risks, benefits and alternatives for the proposed anesthesia with the patient or authorized representative who has indicated his/her understanding and acceptance.       Plan Discussed with: CRNA  Anesthesia Plan Comments:         Anesthesia Quick Evaluation

## 2018-10-05 NOTE — Telephone Encounter (Signed)
referral to [facility] made today

## 2018-10-05 NOTE — Anesthesia Procedure Notes (Signed)
Date/Time: 10/05/2018 10:28 AM Performed by: Talbot Grumbling, CRNA Oxygen Delivery Method: Simple face mask

## 2018-10-05 NOTE — Transfer of Care (Signed)
Immediate Anesthesia Transfer of Care Note  Patient: Latoya Morris  Procedure(s) Performed: UPPER ENDOSCOPIC ULTRASOUND (EUS) RADIAL (N/A ) FINE NEEDLE ASPIRATION (FNA) LINEAR (N/A )  Patient Location: PACU  Anesthesia Type:MAC  Level of Consciousness: sedated  Airway & Oxygen Therapy: Patient Spontanous Breathing and Patient connected to nasal cannula oxygen  Post-op Assessment: Report given to RN and Post -op Vital signs reviewed and stable  Post vital signs: Reviewed and stable  Last Vitals:  Vitals Value Taken Time  BP 143/36 10/05/2018 11:06 AM  Temp    Pulse 70 10/05/2018 11:07 AM  Resp 19 10/05/2018 11:07 AM  SpO2 98 % 10/05/2018 11:07 AM  Vitals shown include unvalidated device data.  Last Pain:  Vitals:   10/05/18 0915  TempSrc: Oral  PainSc: 0-No pain         Complications: No apparent anesthesia complications

## 2018-10-05 NOTE — Anesthesia Postprocedure Evaluation (Signed)
Anesthesia Post Note  Patient: LEWANNA PETRAK  Procedure(s) Performed: UPPER ENDOSCOPIC ULTRASOUND (EUS) RADIAL (N/A ) FINE NEEDLE ASPIRATION (FNA) LINEAR (N/A )     Patient location during evaluation: PACU Anesthesia Type: MAC Level of consciousness: awake and alert Pain management: pain level controlled Vital Signs Assessment: post-procedure vital signs reviewed and stable Respiratory status: spontaneous breathing, nonlabored ventilation, respiratory function stable and patient connected to nasal cannula oxygen Cardiovascular status: stable and blood pressure returned to baseline Postop Assessment: no apparent nausea or vomiting Anesthetic complications: no    Last Vitals:  Vitals:   10/05/18 1140 10/05/18 1145  BP: (!) 144/93 (!) 144/93  Pulse: 67 71  Resp: 16 15  Temp:    SpO2: 99% 99%    Last Pain:  Vitals:   10/05/18 1140  TempSrc:   PainSc: 0-No pain                 Tiajuana Amass

## 2018-10-05 NOTE — Discharge Instructions (Signed)
YOU HAD AN ENDOSCOPIC PROCEDURE TODAY: Refer to the procedure report and other information in the discharge instructions given to you for any specific questions about what was found during the examination. If this information does not answer your questions, please call [place] office at [phone] to clarify.   YOU SHOULD EXPECT: Some feelings of bloating in the abdomen. Passage of more gas than usual. Walking can help get rid of the air that was put into your GI tract during the procedure and reduce the bloating. If you had a lower endoscopy (such as a colonoscopy or flexible sigmoidoscopy) you may notice spotting of blood in your stool or on the toilet paper. Some abdominal soreness may be present for a day or two, also.  DIET: Your first meal following the procedure should be a light meal and then it is ok to progress to your normal diet. A half-sandwich or bowl of soup is an example of a good first meal. Heavy or fried foods are harder to digest and may make you feel nauseous or bloated. Drink plenty of fluids but you should avoid alcoholic beverages for 24 hours. If you had a esophageal dilation, please see attached instructions for diet.    ACTIVITY: Your care partner should take you home directly after the procedure. You should plan to take it easy, moving slowly for the rest of the day. You can resume normal activity the day after the procedure however YOU SHOULD NOT DRIVE, use power tools, machinery or perform tasks that involve climbing or major physical exertion for 24 hours (because of the sedation medicines used during the test).   SYMPTOMS TO REPORT IMMEDIATELY: A gastroenterologist can be reached at any hour. Please call [phone]  for any of the following symptoms:   Following upper endoscopy (EGD, EUS, ERCP, esophageal dilation) Vomiting of blood or coffee ground material  New, significant abdominal pain  New, significant chest pain or pain under the shoulder blades  Painful or  persistently difficult swallowing  New shortness of breath  Black, tarry-looking or red, bloody stools  FOLLOW UP:  If any biopsies were taken you will be contacted by phone or by letter within the next 1-3 weeks. Call [phone]  if you have not heard about the biopsies in 3 weeks.  Please also call with any specific questions about appointments or follow up tests.  

## 2018-10-05 NOTE — Telephone Encounter (Signed)
-----   Message from Milus Banister, MD sent at 10/05/2018 11:25 AM EST ----- Latoya Morris, Just completed EUS, see full report in Epic.  I"ll let you know final path report and my office will arrange onc referral (she prefers [facility]).  Thanks    - 2.7cm mass in the body of pancreas causing main pancreatic duct obstruction and directly abutting the SMV/PV confluence (suggesting invasion). Suspicious peripancreatic adenopathy.  Liver masses noted on recent CT as well (not see during this exam.  Preliminary cytology review is positive for malignancy (likely adenocarcinoma). Await final pathology, this appears to be pancreatic adenocarcinoma, locally advanced and possibly metastatic.    Latoya Morris, She needs referral to [facility] for new diagnosed pancreatic body adenocarcinoma, likely metastatic.  Thanks

## 2018-10-05 NOTE — Op Note (Signed)
[facility] Patient Name: Latoya Morris Procedure Date: 10/05/2018 MRN: [id number] Attending MD: Milus Banister , MD Date of Birth: 1933-09-04 CSN: [id number] Age: 85 Admit Type: Outpatient Procedure:                Upper EUS Indications:              Suspected mass in pancreas on recent [place] Na CT                            scan during brief hospitalization for abdominal pain Providers:                Milus Banister, MD, Cleda Daub, RN, Cletis Athens, Technician, Marla Roe, CRNA Referring MD:             Hildred Laser, MD Medicines:                Monitored Anesthesia Care Complications:            No immediate complications. Estimated blood loss:                            None. Estimated Blood Loss:     Estimated blood loss: none. Procedure:                Pre-Anesthesia Assessment:                           - Prior to the procedure, a History and Physical                            was performed, and patient medications and                            allergies were reviewed. The patient's tolerance of                            previous anesthesia was also reviewed. The risks                            and benefits of the procedure and the sedation                            options and risks were discussed with the patient.                            All questions were answered, and informed consent                            was obtained. Prior Anticoagulants: The patient has                            taken Plavix (clopidogrel), last dose was 5 days  prior to procedure. ASA Grade Assessment: III - A                            patient with severe systemic disease. After                            reviewing the risks and benefits, the patient was                            deemed in satisfactory condition to undergo the                            procedure.                           After obtaining  informed consent, the endoscope was                            passed under direct vision. Throughout the                            procedure, the patient's blood pressure, pulse, and                            oxygen saturations were monitored continuously. The                            [id number] ([id number]) Olympus Linear EUS was                            introduced through the mouth, and advanced to the                            second part of duodenum. The upper EUS was                            accomplished without difficulty. The patient                            tolerated the procedure well. Scope In: Scope Out: Findings:      ENDOSCOPIC FINDING: :      The examined esophagus was endoscopically normal.      The entire examined stomach was endoscopically normal.      The examined duodenum was endoscopically normal.      ENDOSONOGRAPHIC FINDING: :      1. An irregular mass was identified in the pancreatic body. The mass was       hypoechoic and heterogenous. The mass measured 27 mm in maximal       cross-sectional diameter. The endosonographic borders were       poorly-defined. The remainder of the pancreas was examined. The       endosonographic appearance of parenchyma and the upstream pancreatic       duct indicated duct dilation. The mass directly abuts the SMV/PV       confluence (suggesting invasion) Fine needle aspiration for cytology was  performed. Color Doppler imaging was utilized prior to needle puncture       to confirm a lack of significant vascular structures within the needle       path. Three passes were made with the 25 gauge needle using a       transgastric approach. A cytotechnologist was present to evaluate the       adequacy of the specimen. Final cytology results are pending.      2. Pancreatic parenchyma was otherwise normal.      3. Several (3-4) small but suspicious peripancreatic lymphnodes noted       (4-18mm across)      4. CBD normal,  non-dilated.      5. Limited views of liver, spleen were normal. Impression:               - 2.7cm mass in the body of pancreas causing main                            pancreatic duct obstruction and directly abutting                            the SMV/PV confluence (suggesting invasion).                            Suspicious peripancreatic adenopathy. Liver masses                            noted on recent CT as well (not see during this                            exam. Preliminary cytology review is positive for                            malignancy (likely adenocarcinoma). Await final                            pathology, this appears to be pancreatic                            adenocarcinoma, locally advanced and possibly                            metastatic. Moderate Sedation:      Not Applicable - Patient had care per Anesthesia. Recommendation:           - Discharge patient to home (ambulatory).                           - Await pathology results. Procedure Code(s):        --- Professional ---                           [phone], Esophagogastroduodenoscopy, flexible,                            transoral; with transendoscopic ultrasound-guided  intramural or transmural fine needle                            aspiration/biopsy(s), (includes endoscopic                            ultrasound examination limited to the esophagus,                            stomach or duodenum, and adjacent structures) Diagnosis Code(s):        --- Professional ---                           K86.89, Other specified diseases of pancreas                           R93.3, Abnormal findings on diagnostic imaging of                            other parts of digestive tract CPT copyright 2018 [employer]. All rights reserved. The codes documented in this report are preliminary and upon coder review may  be revised to meet current compliance requirements. Milus Banister,  MD 10/05/2018 11:10:07 AM This report has been signed electronically. Number of Addenda: 0

## 2018-10-06 ENCOUNTER — Encounter (HOSPITAL_COMMUNITY): Payer: Self-pay | Admitting: Gastroenterology

## 2018-10-11 ENCOUNTER — Encounter (HOSPITAL_COMMUNITY): Payer: Self-pay | Admitting: Hematology

## 2018-10-11 ENCOUNTER — Inpatient Hospital Stay (HOSPITAL_COMMUNITY): Payer: Medicare Other | Attending: Hematology | Admitting: Hematology

## 2018-10-11 ENCOUNTER — Inpatient Hospital Stay (HOSPITAL_COMMUNITY): Payer: Medicare Other

## 2018-10-11 ENCOUNTER — Other Ambulatory Visit: Payer: Self-pay

## 2018-10-11 VITALS — BP 125/58 | HR 64 | Temp 98.8°F | Resp 18 | Wt 185.8 lb

## 2018-10-11 DIAGNOSIS — Z803 Family history of malignant neoplasm of breast: Secondary | ICD-10-CM | POA: Insufficient documentation

## 2018-10-11 DIAGNOSIS — M7989 Other specified soft tissue disorders: Secondary | ICD-10-CM | POA: Diagnosis not present

## 2018-10-11 DIAGNOSIS — Z801 Family history of malignant neoplasm of trachea, bronchus and lung: Secondary | ICD-10-CM | POA: Insufficient documentation

## 2018-10-11 DIAGNOSIS — Z79899 Other long term (current) drug therapy: Secondary | ICD-10-CM

## 2018-10-11 DIAGNOSIS — F329 Major depressive disorder, single episode, unspecified: Secondary | ICD-10-CM | POA: Insufficient documentation

## 2018-10-11 DIAGNOSIS — K449 Diaphragmatic hernia without obstruction or gangrene: Secondary | ICD-10-CM | POA: Insufficient documentation

## 2018-10-11 DIAGNOSIS — Z7982 Long term (current) use of aspirin: Secondary | ICD-10-CM | POA: Diagnosis not present

## 2018-10-11 DIAGNOSIS — Z8673 Personal history of transient ischemic attack (TIA), and cerebral infarction without residual deficits: Secondary | ICD-10-CM | POA: Insufficient documentation

## 2018-10-11 DIAGNOSIS — F0390 Unspecified dementia without behavioral disturbance: Secondary | ICD-10-CM | POA: Insufficient documentation

## 2018-10-11 DIAGNOSIS — I10 Essential (primary) hypertension: Secondary | ICD-10-CM

## 2018-10-11 DIAGNOSIS — Z87891 Personal history of nicotine dependence: Secondary | ICD-10-CM | POA: Diagnosis not present

## 2018-10-11 DIAGNOSIS — R161 Splenomegaly, not elsewhere classified: Secondary | ICD-10-CM | POA: Insufficient documentation

## 2018-10-11 DIAGNOSIS — R630 Anorexia: Secondary | ICD-10-CM | POA: Diagnosis not present

## 2018-10-11 DIAGNOSIS — C251 Malignant neoplasm of body of pancreas: Secondary | ICD-10-CM | POA: Diagnosis present

## 2018-10-11 DIAGNOSIS — R0602 Shortness of breath: Secondary | ICD-10-CM | POA: Insufficient documentation

## 2018-10-11 DIAGNOSIS — M4316 Spondylolisthesis, lumbar region: Secondary | ICD-10-CM | POA: Diagnosis not present

## 2018-10-11 DIAGNOSIS — K8689 Other specified diseases of pancreas: Secondary | ICD-10-CM

## 2018-10-11 DIAGNOSIS — E785 Hyperlipidemia, unspecified: Secondary | ICD-10-CM

## 2018-10-11 DIAGNOSIS — C259 Malignant neoplasm of pancreas, unspecified: Secondary | ICD-10-CM | POA: Insufficient documentation

## 2018-10-11 DIAGNOSIS — K5900 Constipation, unspecified: Secondary | ICD-10-CM | POA: Diagnosis not present

## 2018-10-11 LAB — COMPREHENSIVE METABOLIC PANEL
ALK PHOS: 150 U/L — AB (ref 38–126)
ALT: 34 U/L (ref 0–44)
AST: 37 U/L (ref 15–41)
Albumin: 3.8 g/dL (ref 3.5–5.0)
Anion gap: 8 (ref 5–15)
BUN: 12 mg/dL (ref 8–23)
CALCIUM: 8.9 mg/dL (ref 8.9–10.3)
CO2: 26 mmol/L (ref 22–32)
Chloride: 105 mmol/L (ref 98–111)
Creatinine, Ser: 0.92 mg/dL (ref 0.44–1.00)
GFR calc Af Amer: >60 mL/min (ref >60)
GFR calc non Af Amer: 58 mL/min — ABNORMAL LOW (ref >60)
Glucose, Bld: 86 mg/dL (ref 70–99)
Potassium: 3.7 mmol/L (ref 3.5–5.1)
Sodium: 139 mmol/L (ref 135–145)
TOTAL PROTEIN: 7 g/dL (ref 6.5–8.1)
Total Bilirubin: 0.7 mg/dL (ref 0.3–1.2)

## 2018-10-11 LAB — CBC WITH DIFFERENTIAL/PLATELET
Abs Immature Granulocytes: 0.04 10*3/uL (ref 0.00–0.07)
Basophils Absolute: 0.1 10*3/uL (ref 0.0–0.1)
Basophils Relative: 1 %
Eosinophils Absolute: 0.2 10*3/uL (ref 0.0–0.5)
Eosinophils Relative: 3 %
HCT: 39.9 % (ref 36.0–46.0)
Hemoglobin: 12.7 g/dL (ref 12.0–15.0)
Immature Granulocytes: 1 %
Lymphocytes Relative: 19 %
Lymphs Abs: 1.4 10*3/uL (ref 0.7–4.0)
MCH: 30.5 pg (ref 26.0–34.0)
MCHC: 31.8 g/dL (ref 30.0–36.0)
MCV: 95.7 fL (ref 80.0–100.0)
MONO ABS: 0.9 10*3/uL (ref 0.1–1.0)
Monocytes Relative: 11 %
NEUTROS PCT: 65 %
Neutro Abs: 5 10*3/uL (ref 1.7–7.7)
Platelets: 162 10*3/uL (ref 150–400)
RBC: 4.17 MIL/uL (ref 3.87–5.11)
RDW: 12.8 % (ref 11.5–15.5)
WBC: 7.6 10*3/uL (ref 4.0–10.5)
nRBC: 0 % (ref 0.0–0.2)

## 2018-10-11 LAB — LACTATE DEHYDROGENASE: LDH: 171 U/L (ref 98–192)

## 2018-10-11 NOTE — Patient Instructions (Addendum)
[facility] [facility] Discharge Instructions   You were seen today by Dr. Delton Coombes, he went over your results and what they mean. He discussed with you and your family how you've been feeling recently and over the past 6 months. He also discussed your family history of cancer. He discussed your abdominal pain as well. Dr. Raliegh Ip examined you and listened to your lungs, he also felt where you said your abdominal pain happens. He went over your pain levels and how much pain medication you have to take throughout the day with your family. The type of pancreatic cancer you have is a common type. The stage of your cancer is unknown at this time due to not knowing what the spots in the liver are. Dr. Raliegh Ip discussed your options, side effects, and what the next steps are at this time  He discussed the spots on your liver and would like to get a PET scan to see if the cancer is anywhere else. We will do blood work today and get you scheduled for the PET scan and then see you back for follow up to go over your results. Dr. Raliegh Ip would like for you to have genetic testing as well do to your family history of cancer.     Thank you for choosing [place] at [facility] to provide your oncology and hematology care.  To afford each patient quality time with our provider, please arrive at least 15 minutes before your scheduled appointment time.   If you have a lab appointment with the [facility] please come in thru the  Main Entrance and check in at the main information desk  You need to re-schedule your appointment should you arrive 10 or more minutes late.  We strive to give you quality time with our providers, and arriving late affects you and other patients whose appointments are after yours.  Also, if you no show three or more times for appointments you may be dismissed from the clinic at the providers discretion.     Again, thank you for choosing [facility].  Our hope is that these requests will decrease the amount of time that you wait before being seen by our physicians.       _____________________________________________________________  Should you have questions after your visit to [facility], please contact our office at [phone] between the hours of 8:00 a.m. and 4:30 p.m.  Voicemails left after 4:00 p.m. will not be returned until the following business day.  For prescription refill requests, have your pharmacy contact our office and allow 72 hours.    Cancer Center Support Programs:   > Cancer Support Group  2nd Tuesday of the month 1pm-2pm, Journey Room

## 2018-10-11 NOTE — Assessment & Plan Note (Signed)
1.  Pancreatic adenocarcinoma: -History of abdominal pain, decreased appetite and decreased performance status for the last 3 to 6 weeks.  She also reports worsening chronic back pain in the last 3 to 6 weeks.  No history of weight loss. -Presented to the ER with abdominal pain on 09/26/2017, CT scan of the abdomen and pelvis showed spiculated and hypoenhancing pancreatic body tumor measuring 3.1 cm, occluded splenic vein and partially encases the proximal SMV.  There are 2-3 hypo-enhancing liver lesions, largest about 1.2 cm.  Splenomegaly with perigastric varices. - EUS on 10/05/2018 and biopsy of the pancreatic mass consistent with adenocarcinoma. 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  She walks with the help of a cane.  She lives with her daughter. -Daughter reports that she has not been doing much for the past 2 to 3 months. -I have discussed the results of the pathology report and CT scan in detail.  I recommended doing a PET CT scan for staging purposes. - We will see her back after the PET CT scan.  We will also obtain a CA 19-9 level.  2.  Family history: Sister had stomach cancer.  Brother was a smoker and had throat cancer.  Another sister had uterine as well as breast cancer.  Daughter had thyroid cancer. -We will make a referral for genetic testing.  3.  Right upper quadrant pain: -She is taking Vicodin 5 mg 1-3 tablets/day.  Right now the pain is well controlled.

## 2018-10-11 NOTE — Progress Notes (Signed)
[facility] NOTE  Patient Care Team: Kirk Ruths, MD as PCP - General (Internal Medicine)  CHIEF COMPLAINTS/PURPOSE OF CONSULTATION: Adenocarcinoma of the pancreas  HISTORY OF PRESENTING ILLNESS:  Latoya Morris 85 y.o. female is here because of newly diagnosed  Adenocarcinoma of the pancreas. She is here today with her family.She reports she went to the ER with abdominal pain in february. They did the CT scans and found her mass. She has continued to have abdominal and back pain since they found her mass. She was sent for a biopsy on 10/05/2018. She denies any weight loss. Denies any nausea, vomiting, or diarrhea. Denies any new pains. Had not noticed any recent bleeding such as epistaxis, hematuria or hematochezia. Denies recent chest pain on exertion, shortness of breath on minimal exertion, pre-syncopal episodes, or palpitations. Denies any numbness or tingling in hands or feet. Denies any recent fevers, infections, or recent hospitalizations. Patient reports appetite at 25% and energy level at 0%. She lives with her daughter after her stroke in 36. She has no energy for the last 3 months and she sleeps all day. She used to help with daily activities but she is unable to at this time. She now needs help with all her ADLs and activities. Her short term memory has declined since her stroke in 2015. She has right sided weakness from this. She walks with a cane. She has a family history of cancer. Her brother had throat cancer. Her sister had breast and cervical cancer. Daughter had thyroid cancer.   MEDICAL HISTORY:  Past Medical History:  Diagnosis Date  . Alzheimer's dementia ([place])   . Anxiety   . Constipation   . Depression   . Hyperlipidemia   . Hypertension   . Stroke [facility])    Doesn't remember year of stroke other than it not being recent    SURGICAL HISTORY: Past Surgical History:  Procedure Laterality Date  . ESOPHAGOGASTRODUODENOSCOPY N/A  10/05/2018   Procedure: ESOPHAGOGASTRODUODENOSCOPY (EGD);  Surgeon: Milus Banister, MD;  Location: [facility];  Service: Endoscopy;  Laterality: N/A;  . EUS N/A 10/05/2018   Procedure: UPPER ENDOSCOPIC ULTRASOUND (EUS) RADIAL;  Surgeon: Milus Banister, MD;  Location: [facility];  Service: Endoscopy;  Laterality: N/A;  . FINE NEEDLE ASPIRATION N/A 10/05/2018   Procedure: FINE NEEDLE ASPIRATION (FNA) LINEAR;  Surgeon: Milus Banister, MD;  Location: [facility];  Service: Endoscopy;  Laterality: N/A;  . TUBAL LIGATION      SOCIAL HISTORY: Social History   Socioeconomic History  . Marital status: Widowed    Spouse name: Not on file  . Number of children: Not on file  . Years of education: Not on file  . Highest education level: Not on file  Occupational History  . Occupation: retired  Scientific laboratory technician  . Financial resource strain: Patient refused  . Food insecurity:    Worry: Patient refused    Inability: Patient refused  . Transportation needs:    Medical: Patient refused    Non-medical: Patient refused  Tobacco Use  . Smoking status: Former Smoker    Packs/day: 1.50    Years: 25.00    Pack years: 37.50    Last attempt to quit: 09/21/2013    Years since quitting: 5.0  . Smokeless tobacco: Never Used  Substance and Sexual Activity  . Alcohol use: Not on file  . Drug use: Never  . Sexual activity: Not Currently  Lifestyle  . Physical activity:  Days per week: Patient refused    Minutes per session: Patient refused  . Stress: Patient refused  Relationships  . Social connections:    Talks on phone: Patient refused    Gets together: Patient refused    Attends religious service: Patient refused    Active member of club or organization: Patient refused    Attends meetings of clubs or organizations: Patient refused    Relationship status: Patient refused  . Intimate partner violence:    Fear of current or ex partner: Patient refused    Emotionally abused: Patient  refused    Physically abused: Patient refused    Forced sexual activity: Patient refused  Other Topics Concern  . Not on file  Social History Narrative  . Not on file    FAMILY HISTORY: Family History  Problem Relation Age of Onset  . Heart disease Mother   . Stomach cancer Sister   . Throat cancer Brother   . Stroke Sister   . Dementia Sister   . Dementia Sister     ALLERGIES:  is allergic to penicillins.  MEDICATIONS:  Current Outpatient Medications  Medication Sig Dispense Refill  . amLODipine (NORVASC) 5 MG tablet Take 5 mg by mouth daily.    Marland Kitchen aspirin EC 325 MG tablet Take 1 tablet (325 mg total) by mouth daily. Hold until biopsy completed.    Marland Kitchen atorvastatin (LIPITOR) 40 MG tablet Take 40 mg by mouth every evening.     . Biotin 10000 MCG TABS Take 1 tablet by mouth daily.    . bisacodyl (DULCOLAX) 5 MG EC tablet Take 2 tablets (10 mg total) by mouth daily as needed for moderate constipation. 30 tablet 0  . clopidogrel (PLAVIX) 75 MG tablet Take 1 tablet (75 mg total) by mouth daily. Hold until biopsy completed.    Marland Kitchen FLUoxetine (PROZAC) 20 MG capsule Take 40 mg by mouth every morning.     . gabapentin (NEURONTIN) 300 MG capsule Take 300 mg by mouth 2 (two) times daily.     Marland Kitchen HYDROcodone-acetaminophen (NORCO/VICODIN) 5-325 MG tablet Take 1 tablet by mouth every 6 (six) hours as needed for moderate pain. 50 tablet 0  . Hypromellose (GENTEAL MILD) 0.2 % SOLN Apply 1-2 drops to eye daily as needed (for dry eye relief).    Marland Kitchen levothyroxine (SYNTHROID, LEVOTHROID) 25 MCG tablet Take 25 mcg by mouth every morning.     . polyethylene glycol (MIRALAX) packet Take 17 g by mouth daily as needed. 28 each 0  . pyrithione zinc (HEAD AND SHOULDERS) 1 % shampoo Apply topically daily as needed for itching.    . risperiDONE (RISPERDAL) 0.25 MG tablet Take 0.25 mg by mouth at bedtime.    . traZODone (DESYREL) 50 MG tablet Take 250 mg by mouth at bedtime.     Marland Kitchen albuterol (PROVENTIL) (2.5  MG/3ML) 0.083% nebulizer solution Take 3 mLs (2.5 mg total) by nebulization every 6 (six) hours as needed for wheezing or shortness of breath. 75 mL 0  . traMADol (ULTRAM) 50 MG tablet Take 50 mg by mouth every 6 (six) hours as needed. For pain     No current facility-administered medications for this visit.     REVIEW OF SYSTEMS:   Constitutional: Denies fevers, chills or abnormal night sweats Eyes: Denies blurriness of vision, double vision or watery eyes Ears, nose, mouth, throat, and face: Denies mucositis or sore throat Respiratory: Denies cough, dyspnea or wheezes Cardiovascular: Denies palpitation, chest discomfort or lower extremity swelling  Gastrointestinal:  +Abdominal pain Denies nausea, heartburn or change in bowel habits Skin: Denies abnormal skin rashes Lymphatics: Denies new lymphadenopathy or easy bruising Neurological:Denies numbness, tingling or new weaknesses Behavioral/Psych: Mood is stable, no new changes  All other systems were reviewed with the patient and are negative.  PHYSICAL EXAMINATION: ECOG PERFORMANCE STATUS: 3 - Symptomatic, >50% confined to bed  Vitals:   10/11/18 1245  BP: (!) 125/58  Pulse: 64  Resp: 18  Temp: 98.8 F (37.1 C)  SpO2: 99%   Filed Weights   10/11/18 1245  Weight: 185 lb 12.8 oz (84.3 kg)    GENERAL:alert, no distress and comfortable SKIN: skin color, texture, turgor are normal, no rashes or significant lesions EYES: normal, conjunctiva are pink and non-injected, sclera clear OROPHARYNX:no exudate, no erythema and lips, buccal mucosa, and tongue normal  NECK: supple, thyroid normal size, non-tender, without nodularity LYMPH:  no palpable lymphadenopathy in the cervical, axillary or inguinal LUNGS: clear to auscultation and percussion with normal breathing effort HEART: regular rate & rhythm and no murmurs and no lower extremity edema ABDOMEN: +abdominal pain abdomen soft and normal bowel sounds Musculoskeletal:no cyanosis  of digits and no clubbing  PSYCH: alert & oriented x 3 with fluent speech NEURO: no focal motor/sensory deficits  LABORATORY DATA:  I have reviewed the data as listed Lab Results  Component Value Date   WBC 7.6 10/11/2018   HGB 12.7 10/11/2018   HCT 39.9 10/11/2018   MCV 95.7 10/11/2018   PLT 162 10/11/2018     Chemistry      Component Value Date/Time   NA 139 10/11/2018 1413   K 3.7 10/11/2018 1413   CL 105 10/11/2018 1413   CO2 26 10/11/2018 1413   BUN 12 10/11/2018 1413   CREATININE 0.92 10/11/2018 1413      Component Value Date/Time   CALCIUM 8.9 10/11/2018 1413   ALKPHOS 150 (H) 10/11/2018 1413   AST 37 10/11/2018 1413   ALT 34 10/11/2018 1413   BILITOT 0.7 10/11/2018 1413       RADIOGRAPHIC STUDIES: I have personally reviewed the radiological images as listed and agreed with the findings in the report. Ct Abdomen Pelvis W Contrast  Result Date: 09/26/2018 CLINICAL DATA:  85 year old female with abdominal pain and distension for several weeks. EXAM: CT ABDOMEN AND PELVIS WITH CONTRAST TECHNIQUE: Multidetector CT imaging of the abdomen and pelvis was performed using the standard protocol following bolus administration of intravenous contrast. CONTRAST:  146mL OMNIPAQUE IOHEXOL 300 MG/ML  SOLN COMPARISON:  CT Abdomen 02/12/2010. FINDINGS: Lower chest: Minimal lung base scarring or atelectasis. No pericardial or pleural effusion. Hepatobiliary: There are 2 subtle hypodense areas in the liver which are new since 2011. There is a 12 millimeter area at the liver dome on series 2, image 12, and a smaller 8-10 millimeter area in the right hepatic lobe on image 28. Negative gallbladder.  No biliary ductal enlargement. Pancreas: Spiculated and hypoenhancing mass in the body of the pancreas encompassing 22 x 23 x 31 millimeters with invasion and occlusion of the splenic vein. Partial encasement of the proximal SMV which remains patent. Regional mesenteric nodularity which may be small  metastatic lymph nodes (coronal image 37, 33). No dilatation of the main pancreatic duct. Spleen: Splenomegaly and trace perisplenic free fluid. Adrenals/Urinary Tract: Adrenal glands remain normal. Chronic left renal atrophy. The right kidney and right ureter are within normal limits. Stomach/Bowel: Negative rectum. Diverticulosis of the sigmoid colon, no definite active inflammation. The more  proximal large bowel is within normal limits. No dilated small bowel. Stomach is remarkable for a small hiatal hernia and perigastric varices. Duodenum remains within normal limits. No free air. Vascular/Lymphatic: Aortoiliac calcified atherosclerosis. Major arterial structures are patent. The splenic vein is being thrombosed by the pancreatic mass (series 2, image 23). There are associated perigastric varices. The main portal vein and SMV remain patent. Peripancreatic nodularity suspicious for small metastatic nodes. Otherwise no overt lymphadenopathy. Reproductive: Diminutive, negative. Other: No pelvic free fluid. Musculoskeletal: Advanced disc degeneration throughout the spine. Degenerative lumbar spondylolisthesis with advanced posterior element degeneration. No acute osseous abnormality identified. IMPRESSION: 1. Spiculated and hypoenhancing pancreatic body tumor 3.1 cm suspicious for Pancreatic Adenocarcinoma. This has occluded the splenic vein (see #2) and partially encases the proximal SMV. There are two small hypoenhancing liver lesions which are new since 2011 and suspicious for early hepatic metastases. 2. Splenomegaly with perigastric varices. 3. Chronic left renal atrophy. Aortic Atherosclerosis (ICD10-I70.0). Electronically Signed   By: Genevie Ann M.D.   On: 09/26/2018 19:34   I have reviewed Francene Finders, NP's note and agree with the documentation.  I personally performed a face-to-face visit, made revisions and my assessment and plan is as follows.   ASSESSMENT & PLAN:  Pancreatic cancer ([place]) 1.   Pancreatic adenocarcinoma: -History of abdominal pain, decreased appetite and decreased performance status for the last 3 to 6 weeks.  She also reports worsening chronic back pain in the last 3 to 6 weeks.  No history of weight loss. -Presented to the ER with abdominal pain on 09/26/2017, CT scan of the abdomen and pelvis showed spiculated and hypoenhancing pancreatic body tumor measuring 3.1 cm, occluded splenic vein and partially encases the proximal SMV.  There are 2-3 hypo-enhancing liver lesions, largest about 1.2 cm.  Splenomegaly with perigastric varices. - EUS on 10/05/2018 and biopsy of the pancreatic mass consistent with adenocarcinoma. 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  She walks with the help of a cane.  She lives with her daughter. -Daughter reports that she has not been doing much for the past 2 to 3 months. -I have discussed the results of the pathology report and CT scan in detail.  I recommended doing a PET CT scan for staging purposes. - We will see her back after the PET CT scan.  We will also obtain a CA 19-9 level.  2.  Family history: Sister had stomach cancer.  Brother was a smoker and had throat cancer.  Another sister had uterine as well as breast cancer.  Daughter had thyroid cancer. -We will make a referral for genetic testing.  3.  Right upper quadrant pain: -She is taking Vicodin 5 mg 1-3 tablets/day.  Right now the pain is well controlled.  Orders Placed This Encounter  Procedures  . NM PET Image Initial (PI) Skull Base To Thigh    Standing Status:   Future    Standing Expiration Date:   10/11/2019    Order Specific Question:   ** REASON FOR EXAM (FREE TEXT)    Answer:   adenocarcinoma of the pancreas    Order Specific Question:   If indicated for the ordered procedure, I authorize the administration of a radiopharmaceutical per Radiology protocol    Answer:   Yes    Order Specific Question:   Preferred imaging location?    Answer:    [facility]    Order Specific Question:   Radiology Contrast Protocol - do NOT  remove file path    Answer:   [email or web address]  . Cancer antigen 19-9    Standing Status:   Future    Number of Occurrences:   1    Standing Expiration Date:   10/11/2019  . CBC with Differential/Platelet    Standing Status:   Future    Number of Occurrences:   1    Standing Expiration Date:   10/11/2019  . Comprehensive metabolic panel    Standing Status:   Future    Number of Occurrences:   1    Standing Expiration Date:   10/11/2019  . Lactate dehydrogenase    Standing Status:   Future    Number of Occurrences:   1    Standing Expiration Date:   10/11/2019    All questions were answered. The patient knows to call the clinic with any problems, questions or concerns.     Derek Jack, MD 10/11/2018 3:57 PM

## 2018-10-12 LAB — CANCER ANTIGEN 19-9: CA 19-9: 2645 U/mL — ABNORMAL HIGH (ref 0–35)

## 2018-10-16 ENCOUNTER — Other Ambulatory Visit: Payer: Self-pay

## 2018-10-16 ENCOUNTER — Ambulatory Visit
Admission: RE | Admit: 2018-10-16 | Discharge: 2018-10-16 | Disposition: A | Discharge status: Home / Self Care | Payer: Medicare Other | Source: Ambulatory Visit | Attending: Nurse Practitioner | Admitting: Nurse Practitioner

## 2018-10-16 DIAGNOSIS — C251 Malignant neoplasm of body of pancreas: Secondary | ICD-10-CM | POA: Insufficient documentation

## 2018-10-16 LAB — GLUCOSE, CAPILLARY: Glucose-Capillary: 128 mg/dL — ABNORMAL HIGH (ref 70–99)

## 2018-10-16 MED ORDER — FLUDEOXYGLUCOSE F - 18 (FDG) INJECTION
9.6000 | Freq: Once | INTRAVENOUS | Status: AC | PRN
  Administered 2018-10-16: 9.93 via INTRAVENOUS

## 2018-10-18 ENCOUNTER — Inpatient Hospital Stay (HOSPITAL_BASED_OUTPATIENT_CLINIC_OR_DEPARTMENT_OTHER): Payer: Medicare Other | Admitting: Hematology

## 2018-10-18 ENCOUNTER — Encounter (HOSPITAL_COMMUNITY): Payer: Self-pay | Admitting: Hematology

## 2018-10-18 ENCOUNTER — Encounter (HOSPITAL_COMMUNITY): Payer: Self-pay | Admitting: Lab

## 2018-10-18 ENCOUNTER — Other Ambulatory Visit: Payer: Self-pay

## 2018-10-18 DIAGNOSIS — C251 Malignant neoplasm of body of pancreas: Secondary | ICD-10-CM

## 2018-10-18 DIAGNOSIS — K5900 Constipation, unspecified: Secondary | ICD-10-CM

## 2018-10-18 DIAGNOSIS — F0390 Unspecified dementia without behavioral disturbance: Secondary | ICD-10-CM

## 2018-10-18 DIAGNOSIS — Z801 Family history of malignant neoplasm of trachea, bronchus and lung: Secondary | ICD-10-CM

## 2018-10-18 DIAGNOSIS — Z7982 Long term (current) use of aspirin: Secondary | ICD-10-CM

## 2018-10-18 DIAGNOSIS — Z803 Family history of malignant neoplasm of breast: Secondary | ICD-10-CM

## 2018-10-18 DIAGNOSIS — I10 Essential (primary) hypertension: Secondary | ICD-10-CM

## 2018-10-18 DIAGNOSIS — Z87891 Personal history of nicotine dependence: Secondary | ICD-10-CM

## 2018-10-18 DIAGNOSIS — M4316 Spondylolisthesis, lumbar region: Secondary | ICD-10-CM

## 2018-10-18 DIAGNOSIS — E785 Hyperlipidemia, unspecified: Secondary | ICD-10-CM

## 2018-10-18 DIAGNOSIS — M7989 Other specified soft tissue disorders: Secondary | ICD-10-CM

## 2018-10-18 DIAGNOSIS — R161 Splenomegaly, not elsewhere classified: Secondary | ICD-10-CM

## 2018-10-18 DIAGNOSIS — F329 Major depressive disorder, single episode, unspecified: Secondary | ICD-10-CM

## 2018-10-18 DIAGNOSIS — R0602 Shortness of breath: Secondary | ICD-10-CM

## 2018-10-18 DIAGNOSIS — R630 Anorexia: Secondary | ICD-10-CM

## 2018-10-18 DIAGNOSIS — K449 Diaphragmatic hernia without obstruction or gangrene: Secondary | ICD-10-CM

## 2018-10-18 DIAGNOSIS — Z79899 Other long term (current) drug therapy: Secondary | ICD-10-CM

## 2018-10-18 DIAGNOSIS — Z8673 Personal history of transient ischemic attack (TIA), and cerebral infarction without residual deficits: Secondary | ICD-10-CM

## 2018-10-18 MED ORDER — HYDROCODONE-ACETAMINOPHEN 5-325 MG PO TABS: 1.0000 | ORAL_TABLET | ORAL | 0 refills | Status: AC | PRN

## 2018-10-18 NOTE — Patient Instructions (Addendum)
[facility]  Discharge Instructions:  You saw Dr. Delton Coombes today. He went over your scan results and thinks that Hospice care maybe best for you at this time.  _______________________________________________________________  Thank you for choosing [facility] [facility] to provide your oncology and hematology care.  To afford each patient quality time with our providers, please arrive at least 15 minutes before your scheduled appointment.  You need to re-schedule your appointment if you arrive 10 or more minutes late.  We strive to give you quality time with our providers, and arriving late affects you and other patients whose appointments are after yours.  Also, if you no show three or more times for appointments you may be dismissed from the clinic.  Again, thank you for choosing [facility] hope is that these requests will allow you access to exceptional care and in a timely manner. _______________________________________________________________  If you have questions after your visit, please contact our office at [phone] between the hours of 8:30 a.m. and 5:00 p.m. Voicemails left after 4:30 p.m. will not be returned until the following business day. _______________________________________________________________  For prescription refill requests, have your pharmacy contact our office. _______________________________________________________________  Recommendations made by the consultant and any test results will be sent to your referring physician. _______________________________________________________________

## 2018-10-18 NOTE — Progress Notes (Signed)
Latoya Morris, [place] [address]   CLINIC:  Medical Oncology/Hematology  PCP:  Kirk Ruths, MD [place] [address] [phone]   REASON FOR VISIT:  Follow-up for pancreatic cancer.   INTERVAL HISTORY:  Latoya Morris 85 y.o. female returns for routine follow-up and review of scans. She is here today with her daughter and daughter-in-law. She states that her appetite has change and she is not eating well. She states that she has some abdominal pain as well. Denies any nausea, vomiting, or diarrhea. Denies any new pains. Had not noticed any recent bleeding such as epistaxis, hematuria or hematochezia. Denies recent chest pain on exertion, pre-syncopal episodes, or palpitations. Denies any numbness or tingling in hands or feet. Denies any recent fevers, infections, or recent hospitalizations. Patient reports appetite at 25% and energy level at 0%.  Marland Kitchen      REVIEW OF SYSTEMS:  Review of Systems  Constitutional: Positive for appetite change and fatigue.  Respiratory: Positive for shortness of breath.   Cardiovascular: Positive for leg swelling.  Gastrointestinal: Positive for abdominal pain.  Hematological: Bruises/bleeds easily.     PAST MEDICAL/SURGICAL HISTORY:  Past Medical History:  Diagnosis Date  . Alzheimer's dementia ([place])   . Anxiety   . Constipation   . Depression   . Hyperlipidemia   . Hypertension   . Stroke [facility])    Doesn't remember year of stroke other than it not being recent   Past Surgical History:  Procedure Laterality Date  . ESOPHAGOGASTRODUODENOSCOPY N/A 10/05/2018   Procedure: ESOPHAGOGASTRODUODENOSCOPY (EGD);  Surgeon: Milus Banister, MD;  Location: [facility];  Service: Endoscopy;  Laterality: N/A;  . EUS N/A 10/05/2018   Procedure: UPPER ENDOSCOPIC ULTRASOUND (EUS) RADIAL;  Surgeon: Milus Banister, MD;  Location: [facility];  Service: Endoscopy;   Laterality: N/A;  . FINE NEEDLE ASPIRATION N/A 10/05/2018   Procedure: FINE NEEDLE ASPIRATION (FNA) LINEAR;  Surgeon: Milus Banister, MD;  Location: [facility];  Service: Endoscopy;  Laterality: N/A;  . TUBAL LIGATION       SOCIAL HISTORY:  Social History   Socioeconomic History  . Marital status: Widowed    Spouse name: Not on file  . Number of children: Not on file  . Years of education: Not on file  . Highest education level: Not on file  Occupational History  . Occupation: retired  Scientific laboratory technician  . Financial resource strain: Patient refused  . Food insecurity:    Worry: Patient refused    Inability: Patient refused  . Transportation needs:    Medical: Patient refused    Non-medical: Patient refused  Tobacco Use  . Smoking status: Former Smoker    Packs/day: 1.50    Years: 25.00    Pack years: 37.50    Last attempt to quit: 09/21/2013    Years since quitting: 5.0  . Smokeless tobacco: Never Used  Substance and Sexual Activity  . Alcohol use: Not on file  . Drug use: Never  . Sexual activity: Not Currently  Lifestyle  . Physical activity:    Days per week: Patient refused    Minutes per session: Patient refused  . Stress: Patient refused  Relationships  . Social connections:    Talks on phone: Patient refused    Gets together: Patient refused    Attends religious service: Patient refused    Active member of club or organization: Patient refused  Attends meetings of clubs or organizations: Patient refused    Relationship status: Patient refused  . Intimate partner violence:    Fear of current or ex partner: Patient refused    Emotionally abused: Patient refused    Physically abused: Patient refused    Forced sexual activity: Patient refused  Other Topics Concern  . Not on file  Social History Narrative  . Not on file    FAMILY HISTORY:  Family History  Problem Relation Age of Onset  . Heart disease Mother   . Stomach cancer Sister   . Throat  cancer Brother   . Stroke Sister   . Dementia Sister   . Dementia Sister     CURRENT MEDICATIONS:  Outpatient Encounter Medications as of 10/18/2018  Medication Sig  . albuterol (PROVENTIL) (2.5 MG/3ML) 0.083% nebulizer solution Take 3 mLs (2.5 mg total) by nebulization every 6 (six) hours as needed for wheezing or shortness of breath.  Marland Kitchen amLODipine (NORVASC) 5 MG tablet Take 5 mg by mouth daily.  Marland Kitchen aspirin EC 325 MG tablet Take 1 tablet (325 mg total) by mouth daily. Hold until biopsy completed.  Marland Kitchen atorvastatin (LIPITOR) 40 MG tablet Take 40 mg by mouth every evening.   . Biotin 10000 MCG TABS Take 1 tablet by mouth daily.  . bisacodyl (DULCOLAX) 5 MG EC tablet Take 2 tablets (10 mg total) by mouth daily as needed for moderate constipation.  . clopidogrel (PLAVIX) 75 MG tablet Take 1 tablet (75 mg total) by mouth daily. Hold until biopsy completed.  Marland Kitchen FLUoxetine (PROZAC) 20 MG capsule Take 40 mg by mouth every morning.   . gabapentin (NEURONTIN) 300 MG capsule Take 300 mg by mouth 2 (two) times daily.   Marland Kitchen HYDROcodone-acetaminophen (NORCO/VICODIN) 5-325 MG tablet Take 1 tablet by mouth every 4 (four) hours as needed for moderate pain.  . Hypromellose (GENTEAL MILD) 0.2 % SOLN Apply 1-2 drops to eye daily as needed (for dry eye relief).  Marland Kitchen levothyroxine (SYNTHROID, LEVOTHROID) 25 MCG tablet Take 25 mcg by mouth every morning.   . polyethylene glycol (MIRALAX) packet Take 17 g by mouth daily as needed.  . pyrithione zinc (HEAD AND SHOULDERS) 1 % shampoo Apply topically daily as needed for itching.  . risperiDONE (RISPERDAL) 0.25 MG tablet Take 0.25 mg by mouth at bedtime.  . traMADol (ULTRAM) 50 MG tablet Take 50 mg by mouth every 6 (six) hours as needed. For pain  . traZODone (DESYREL) 50 MG tablet Take 250 mg by mouth at bedtime.   . [DISCONTINUED] HYDROcodone-acetaminophen (NORCO/VICODIN) 5-325 MG tablet Take 1 tablet by mouth every 6 (six) hours as needed for moderate pain.   No  facility-administered encounter medications on file as of 10/18/2018.     ALLERGIES:  Allergies  Allergen Reactions  . Penicillins     Did it involve swelling of the face/tongue/throat, SOB, or low BP? Unknown Did it involve sudden or severe rash/hives, skin peeling, or any reaction on the inside of your mouth or nose? Unknown Did you need to seek medical attention at a hospital or doctor's office? Unknown  When did it last happen? If all above answers are "NO", may proceed with cephalosporin use.      PHYSICAL EXAM:  ECOG Performance status: 3  Vitals:   10/18/18 1112  BP: (!) 137/48  Pulse: 69  Resp: 20  Temp: 98.5 F (36.9 C)  SpO2: 96%   Filed Weights   10/18/18 1112  Weight: 180 lb (81.6 kg)  Physical Exam Cardiovascular:     Rate and Rhythm: Normal rate and regular rhythm.  Pulmonary:     Effort: Pulmonary effort is normal.     Breath sounds: Normal breath sounds.  Neurological:     Mental Status: She is alert and oriented to person, place, and time.  Psychiatric:        Mood and Affect: Mood normal.        Behavior: Behavior normal.      LABORATORY DATA:  I have reviewed the labs as listed.  CBC    Component Value Date/Time   WBC 7.6 10/11/2018 1413   RBC 4.17 10/11/2018 1413   HGB 12.7 10/11/2018 1413   HCT 39.9 10/11/2018 1413   PLT 162 10/11/2018 1413   MCV 95.7 10/11/2018 1413   MCH 30.5 10/11/2018 1413   MCHC 31.8 10/11/2018 1413   RDW 12.8 10/11/2018 1413   LYMPHSABS 1.4 10/11/2018 1413   MONOABS 0.9 10/11/2018 1413   EOSABS 0.2 10/11/2018 1413   BASOSABS 0.1 10/11/2018 1413   CMP Latest Ref Rng & Units 10/11/2018 09/27/2018 09/26/2018  Glucose 70 - 99 mg/dL 86 140(H) 183(H)  BUN 8 - 23 mg/dL 12 8 11   Creatinine 0.44 - 1.00 mg/dL 0.92 0.78 0.85  Sodium 135 - 145 mmol/L 139 140 138  Potassium 3.5 - 5.1 mmol/L 3.7 3.5 3.9  Chloride 98 - 111 mmol/L 105 108 105  CO2 22 - 32 mmol/L 26 23 25   Calcium 8.9 - 10.3 mg/dL 8.9 8.5(L)  8.7(L)  Total Protein 6.5 - 8.1 g/dL 7.0 - 7.1  Total Bilirubin 0.3 - 1.2 mg/dL 0.7 - 0.7  Alkaline Phos 38 - 126 U/L 150(H) - 78  AST 15 - 41 U/L 37 - 32  ALT 0 - 44 U/L 34 - 31       DIAGNOSTIC IMAGING:  I have independently reviewed the scans and discussed with the patient.     ASSESSMENT & PLAN:   Pancreatic cancer ([place]) 1.  Pancreatic adenocarcinoma: -History of abdominal pain, decreased appetite and decreased performance status for the last 3 to 6 weeks.  She also reports worsening chronic back pain in the last 3 to 6 weeks.  No history of weight loss. -Presented to the ER with abdominal pain on 09/26/2017, CT scan of the abdomen and pelvis showed spiculated and hypoenhancing pancreatic body tumor measuring 3.1 cm, occluded splenic vein and partially encases the proximal SMV.  There are 2-3 hypo-enhancing liver lesions, largest about 1.2 cm.  Splenomegaly with perigastric varices. - EUS on 10/05/2018 and biopsy of the pancreatic mass consistent with adenocarcinoma. 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  She walks with the help of a cane.  She lives with her daughter. -He is mostly confined to wheelchair. -Her CA 19-9 is elevated at 2645. -We have reviewed PET scan results which showed few hypermetabolic lesions in the liver. -We talked about the normal prognosis of metastatic pancreatic cancer.  Based on her performance status, I do not believe she is a candidate for any active therapy. -I had a prolonged discussion with the patient and her family.  I have recommended best supportive care in the form of hospice.  They are agreeable to this option.  We will make a referral.  2.  Family history: Sister had stomach cancer.  Brother was a smoker and had throat cancer.  Another sister had uterine as well as breast cancer.  Daughter had thyroid cancer. -We will  make a referral for genetic testing.  3.  Right upper quadrant pain: -She is taking Vicodin 5 mg 1  to 3 tablets/day.  It is not helping completely.  I will increase it to every 6 hours as needed.   Total time spent is 40 minutes with more than 50% of the time spent face-to-face discussing scan results, prognosis, management options and coordination of care.    Orders placed this encounter:  No orders of the defined types were placed in this encounter.     Derek Jack, MD [place] [phone]

## 2018-10-18 NOTE — Progress Notes (Unsigned)
Referral sent to [facility]. Records faxed on 3/11

## 2018-10-18 NOTE — Assessment & Plan Note (Signed)
1.  Pancreatic adenocarcinoma: -History of abdominal pain, decreased appetite and decreased performance status for the last 3 to 6 weeks.  She also reports worsening chronic back pain in the last 3 to 6 weeks.  No history of weight loss. -Presented to the ER with abdominal pain on 09/26/2017, CT scan of the abdomen and pelvis showed spiculated and hypoenhancing pancreatic body tumor measuring 3.1 cm, occluded splenic vein and partially encases the proximal SMV.  There are 2-3 hypo-enhancing liver lesions, largest about 1.2 cm.  Splenomegaly with perigastric varices. - EUS on 10/05/2018 and biopsy of the pancreatic mass consistent with adenocarcinoma. 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  She walks with the help of a cane.  She lives with her daughter. -He is mostly confined to wheelchair. -Her CA 19-9 is elevated at 2645. -We have reviewed PET scan results which showed few hypermetabolic lesions in the liver. -We talked about the normal prognosis of metastatic pancreatic cancer.  Based on her performance status, I do not believe she is a candidate for any active therapy. -I had a prolonged discussion with the patient and her family.  I have recommended best supportive care in the form of hospice.  They are agreeable to this option.  We will make a referral.  2.  Family history: Sister had stomach cancer.  Brother was a smoker and had throat cancer.  Another sister had uterine as well as breast cancer.  Daughter had thyroid cancer. -We will make a referral for genetic testing.  3.  Right upper quadrant pain: -She is taking Vicodin 5 mg 1 to 3 tablets/day.  It is not helping completely.  I will increase it to every 6 hours as needed.

## 2018-10-23 ENCOUNTER — Other Ambulatory Visit (HOSPITAL_COMMUNITY): Payer: Medicare Other

## 2018-11-23 ENCOUNTER — Encounter (HOSPITAL_COMMUNITY): Payer: Medicare Other | Admitting: Genetic Counselor

## 2018-11-23 ENCOUNTER — Other Ambulatory Visit (HOSPITAL_COMMUNITY): Payer: Medicare Other

## 2018-12-08 DEATH — deceased
